# Patient Record
Sex: Male | Born: 1970
Health system: Southern US, Community
[De-identification: ages and names within clinical notes are randomized; demographics above are authoritative.]

## PROBLEM LIST (undated history)

## (undated) DIAGNOSIS — M2550 Pain in unspecified joint: Secondary | ICD-10-CM

## (undated) HISTORY — DX: Pain in unspecified joint: M25.50

## (undated) HISTORY — PX: KNEE ARTHROSCOPY: SUR90

## (undated) HISTORY — PX: SHOULDER ARTHROSCOPY: SHX128

---

## 1999-07-26 ENCOUNTER — Emergency Department (HOSPITAL_COMMUNITY): Admission: EM | Admit: 1999-07-26 | Discharge: 1999-07-26 | Payer: Self-pay | Admitting: Emergency Medicine

## 2000-02-21 ENCOUNTER — Emergency Department (HOSPITAL_COMMUNITY): Admission: EM | Admit: 2000-02-21 | Discharge: 2000-02-21 | Payer: Self-pay | Admitting: Emergency Medicine

## 2000-09-07 ENCOUNTER — Encounter: Payer: Self-pay | Admitting: Emergency Medicine

## 2000-09-07 ENCOUNTER — Emergency Department (HOSPITAL_COMMUNITY): Admission: EM | Admit: 2000-09-07 | Discharge: 2000-09-07 | Payer: Self-pay | Admitting: Emergency Medicine

## 2000-10-02 ENCOUNTER — Emergency Department (HOSPITAL_COMMUNITY): Admission: EM | Admit: 2000-10-02 | Discharge: 2000-10-02 | Payer: Self-pay | Admitting: Emergency Medicine

## 2001-03-05 ENCOUNTER — Emergency Department (HOSPITAL_COMMUNITY): Admission: EM | Admit: 2001-03-05 | Discharge: 2001-03-05 | Payer: Self-pay | Admitting: Emergency Medicine

## 2001-04-21 ENCOUNTER — Encounter: Admission: RE | Admit: 2001-04-21 | Discharge: 2001-07-20 | Payer: Self-pay | Admitting: Occupational Medicine

## 2001-05-03 ENCOUNTER — Emergency Department (HOSPITAL_COMMUNITY): Admission: EM | Admit: 2001-05-03 | Discharge: 2001-05-03 | Payer: Self-pay | Admitting: *Deleted

## 2001-05-28 ENCOUNTER — Emergency Department (HOSPITAL_COMMUNITY): Admission: EM | Admit: 2001-05-28 | Discharge: 2001-05-28 | Payer: Self-pay | Admitting: Emergency Medicine

## 2001-05-28 ENCOUNTER — Encounter: Payer: Self-pay | Admitting: Emergency Medicine

## 2001-05-31 ENCOUNTER — Emergency Department (HOSPITAL_COMMUNITY): Admission: EM | Admit: 2001-05-31 | Discharge: 2001-05-31 | Payer: Self-pay | Admitting: Emergency Medicine

## 2002-11-07 ENCOUNTER — Encounter: Admission: RE | Admit: 2002-11-07 | Discharge: 2002-11-07 | Payer: Self-pay | Admitting: Rheumatology

## 2002-11-07 ENCOUNTER — Encounter: Payer: Self-pay | Admitting: Rheumatology

## 2003-06-17 ENCOUNTER — Emergency Department (HOSPITAL_COMMUNITY): Admission: EM | Admit: 2003-06-17 | Discharge: 2003-06-17 | Payer: Self-pay | Admitting: Emergency Medicine

## 2003-06-26 ENCOUNTER — Emergency Department (HOSPITAL_COMMUNITY): Admission: AD | Admit: 2003-06-26 | Discharge: 2003-06-26 | Payer: Self-pay | Admitting: Family Medicine

## 2004-03-10 ENCOUNTER — Emergency Department (HOSPITAL_COMMUNITY): Admission: EM | Admit: 2004-03-10 | Discharge: 2004-03-10 | Payer: Self-pay | Admitting: Emergency Medicine

## 2005-08-12 ENCOUNTER — Ambulatory Visit: Payer: Self-pay | Admitting: Internal Medicine

## 2005-10-11 ENCOUNTER — Emergency Department (HOSPITAL_COMMUNITY): Admission: EM | Admit: 2005-10-11 | Discharge: 2005-10-11 | Payer: Self-pay | Admitting: Emergency Medicine

## 2005-10-26 ENCOUNTER — Ambulatory Visit: Payer: Self-pay | Admitting: Internal Medicine

## 2006-02-18 ENCOUNTER — Emergency Department (HOSPITAL_COMMUNITY): Admission: EM | Admit: 2006-02-18 | Discharge: 2006-02-18 | Payer: Self-pay | Admitting: Emergency Medicine

## 2006-02-19 ENCOUNTER — Ambulatory Visit: Payer: Self-pay | Admitting: Internal Medicine

## 2007-06-06 ENCOUNTER — Ambulatory Visit: Payer: Self-pay | Admitting: Internal Medicine

## 2007-06-06 LAB — CONVERTED CEMR LAB
ALT: 31 units/L (ref 0–53)
AST: 30 units/L (ref 0–37)
Albumin: 3.8 g/dL (ref 3.5–5.2)
Calcium: 9.4 mg/dL (ref 8.4–10.5)
Eosinophils Absolute: 0.2 10*3/uL (ref 0.0–0.6)
Eosinophils Relative: 2.5 % (ref 0.0–5.0)
GFR calc Af Amer: 99 mL/min
GFR calc non Af Amer: 81 mL/min
HCT: 40.2 % (ref 39.0–52.0)
Hemoglobin: 13.9 g/dL (ref 13.0–17.0)
LDL Cholesterol: 115 mg/dL — ABNORMAL HIGH (ref 0–99)
Lymphocytes Relative: 33.1 % (ref 12.0–46.0)
MCHC: 34.5 g/dL (ref 30.0–36.0)
Monocytes Absolute: 0.7 10*3/uL (ref 0.2–0.7)
Monocytes Relative: 9.3 % (ref 3.0–11.0)
Neutro Abs: 4.4 10*3/uL (ref 1.4–7.7)
Neutrophils Relative %: 55 % (ref 43.0–77.0)
Platelets: 209 10*3/uL (ref 150–400)
Potassium: 4.6 meq/L (ref 3.5–5.1)
RDW: 12.5 % (ref 11.5–14.6)
Sodium: 144 meq/L (ref 135–145)
Triglycerides: 46 mg/dL (ref 0–149)

## 2007-06-14 ENCOUNTER — Ambulatory Visit: Payer: Self-pay | Admitting: Internal Medicine

## 2007-06-14 DIAGNOSIS — A630 Anogenital (venereal) warts: Secondary | ICD-10-CM | POA: Insufficient documentation

## 2007-06-14 DIAGNOSIS — M129 Arthropathy, unspecified: Secondary | ICD-10-CM | POA: Insufficient documentation

## 2007-06-23 ENCOUNTER — Encounter: Payer: Self-pay | Admitting: Internal Medicine

## 2007-07-12 ENCOUNTER — Encounter: Payer: Self-pay | Admitting: Internal Medicine

## 2007-07-14 ENCOUNTER — Encounter: Payer: Self-pay | Admitting: Internal Medicine

## 2007-09-05 ENCOUNTER — Encounter: Payer: Self-pay | Admitting: Internal Medicine

## 2007-09-19 ENCOUNTER — Encounter: Payer: Self-pay | Admitting: Internal Medicine

## 2008-02-21 ENCOUNTER — Ambulatory Visit: Payer: Self-pay | Admitting: Internal Medicine

## 2008-02-21 LAB — CONVERTED CEMR LAB

## 2008-02-22 ENCOUNTER — Telehealth: Payer: Self-pay | Admitting: Internal Medicine

## 2008-10-02 ENCOUNTER — Ambulatory Visit: Payer: Self-pay | Admitting: Internal Medicine

## 2008-10-02 DIAGNOSIS — M549 Dorsalgia, unspecified: Secondary | ICD-10-CM | POA: Insufficient documentation

## 2011-08-06 ENCOUNTER — Encounter (HOSPITAL_COMMUNITY): Payer: Self-pay | Admitting: *Deleted

## 2011-08-06 ENCOUNTER — Emergency Department (HOSPITAL_COMMUNITY)
Admission: EM | Admit: 2011-08-06 | Discharge: 2011-08-06 | Disposition: A | Discharge status: Home / Self Care | Payer: Self-pay | Attending: Emergency Medicine | Admitting: Emergency Medicine

## 2011-08-06 DIAGNOSIS — L989 Disorder of the skin and subcutaneous tissue, unspecified: Secondary | ICD-10-CM | POA: Insufficient documentation

## 2011-08-06 DIAGNOSIS — R109 Unspecified abdominal pain: Secondary | ICD-10-CM | POA: Insufficient documentation

## 2011-08-06 DIAGNOSIS — R110 Nausea: Secondary | ICD-10-CM | POA: Insufficient documentation

## 2011-08-06 DIAGNOSIS — B999 Unspecified infectious disease: Secondary | ICD-10-CM | POA: Insufficient documentation

## 2011-08-06 MED ORDER — CEPHALEXIN 500 MG PO CAPS: 500.0000 mg | ORAL_CAPSULE | Freq: Three times a day (TID) | ORAL | Status: AC

## 2011-08-06 MED ORDER — HYDROCODONE-ACETAMINOPHEN 5-325 MG PO TABS: 1.0000 | ORAL_TABLET | Freq: Four times a day (QID) | ORAL | Status: AC | PRN

## 2011-08-06 NOTE — ED Notes (Signed)
Patient states onset one year ago growth right groin increasing in size and pain overtime. No drainage present. Pain 6-10/10 achy.  Airway intact bilateral equal chest rise and fall.

## 2011-08-06 NOTE — ED Notes (Signed)
Patient discussed complaint with EDP. Patient not in room was given discharge papers. Patient verbalized understanding with EDP.

## 2011-08-06 NOTE — ED Provider Notes (Signed)
History     CSN: [id number]  Arrival date & time 08/06/11  1256   First MD Initiated Contact with Patient 08/06/11 1312      Chief Complaint  Patient presents with  . Abscess    (Consider location/radiation/quality/duration/timing/severity/associated sxs/prior treatment) HPI Comments: Patient reports he has had a growth in his right groin for approximately one year.  States that he has been putting iodine, witch hazel, alcohol, Clorox, and rubber bands on it without improvement.  States that recently it has become much more painful and smells bad, has some kind of discharge coming from it.  Denies fevers.   Patient is a 41 y.o. male presenting with abscess. The history is provided by the patient.  Abscess  Pertinent negatives include no fever and no vomiting.    History reviewed. No pertinent past medical history.  Past Surgical History  Procedure Date  . Knee arthroscopy   . Shoulder arthroscopy     History reviewed. No pertinent family history.  History  Substance Use Topics  . Smoking status: Current Everyday Smoker -- 0.5 packs/day  . Smokeless tobacco: Not on file  . Alcohol Use: Yes     socially      Review of Systems  Constitutional: Negative for fever.  Gastrointestinal: Positive for nausea. Negative for vomiting.  All other systems reviewed and are negative.    Allergies  Review of patient's allergies indicates no known allergies.  Home Medications   Current Outpatient Rx  Name Route Sig Dispense Refill  . HYDROCODONE-ACETAMINOPHEN 5-500 MG PO TABS Oral Take 1 tablet by mouth every 6 (six) hours as needed. For pain      BP 134/74  Pulse 83  Temp(Src) 99 F (37.2 C) (Oral)  Resp 18  Ht 5\' 6"  (1.676 m)  Wt 205 lb (92.987 kg)  BMI 33.09 kg/m2  SpO2 96%  Physical Exam  Nursing note and vitals reviewed. Constitutional: He is oriented to person, place, and time. He appears well-developed and well-nourished.  HENT:  Head: Normocephalic and  atraumatic.  Neck: Neck supple.  Pulmonary/Chest: Effort normal.  Neurological: He is alert and oriented to person, place, and time.  Skin: Lesion noted. No rash noted.          Lesion is not warm.  No edema or erythema.      ED Course  Procedures (including critical care time)  Labs Reviewed - No data to display No results found.  Discussed patient with Dr Patrica Duel who also examined the lesion and agrees with dermatology follow up and no procedure here.     1. Skin lesion   2. Superinfection       MDM  Well-appearing patient with chronic lesion at right groin with appearance of granuloma - patient may have developed a mild superinfection though this lesion is not consistent with an abscess or other acute condition.  Given abnormal appearance, recommended dermatology follow up.  Patient verbalizes understanding and agrees with plan.         Rise Patience, [place] 08/06/11 1549

## 2011-08-06 NOTE — ED Notes (Signed)
Patient states that he had onset of growth x 6 months ago.  Pt states that he cut it off and now it has come back and is 3 times the size that it was.  Pt has been mashing and poking at the area and now has drainage and bleeding with foul smell.
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
## 2011-08-07 NOTE — ED Provider Notes (Signed)
Medical screening examination/treatment/procedure(s) were performed by non-physician practitioner and as supervising physician I was immediately available for consultation/collaboration.   Marrian Bells A. Patrica Duel, MD 08/07/11 1456

## 2011-09-04 ENCOUNTER — Encounter (INDEPENDENT_AMBULATORY_CARE_PROVIDER_SITE_OTHER): Payer: Self-pay | Admitting: General Surgery

## 2011-10-07 ENCOUNTER — Encounter (INDEPENDENT_AMBULATORY_CARE_PROVIDER_SITE_OTHER): Payer: Self-pay | Admitting: Surgery

## 2011-10-07 ENCOUNTER — Ambulatory Visit (INDEPENDENT_AMBULATORY_CARE_PROVIDER_SITE_OTHER): Payer: Self-pay | Admitting: Surgery

## 2011-10-07 ENCOUNTER — Other Ambulatory Visit (INDEPENDENT_AMBULATORY_CARE_PROVIDER_SITE_OTHER): Payer: Self-pay | Admitting: Surgery

## 2011-10-07 VITALS — BP 128/72 | HR 78 | Temp 98.0°F | Resp 18 | Ht 67.0 in | Wt 221.5 lb

## 2011-10-07 DIAGNOSIS — A630 Anogenital (venereal) warts: Secondary | ICD-10-CM

## 2011-10-07 MED ORDER — DOXYCYCLINE HYCLATE 100 MG PO TABS: 100.0000 mg | ORAL_TABLET | Freq: Two times a day (BID) | ORAL | Status: AC

## 2011-10-07 NOTE — Progress Notes (Signed)
Chief Complaint:  Anal warts  History of Present Illness:  Brad Gray is an 41 y.o. male referred by Dr. Janalyn Harder with anal warts.  The patient said that they have recently popped out. He would like to have something done about them. I described exam under anesthesia and excision with a Bovie or obliteration with the CO2 laser. He was to go ahead and schedule this I will go and get this worked out.  Past Medical History  Diagnosis Date  . Joint pain     Past Surgical History  Procedure Date  . Knee arthroscopy   . Shoulder arthroscopy     Current Outpatient Prescriptions  Medication Sig Dispense Refill  . HYDROcodone-acetaminophen (VICODIN) 5-500 MG per tablet Take 1 tablet by mouth every 6 (six) hours as needed. For pain       Review of patient's allergies indicates no known allergies. No family history on file. Social History:   reports that he has been smoking.  He does not have any smokeless tobacco history on file. He reports that he drinks alcohol. He reports that he does not use illicit drugs.   REVIEW OF SYSTEMS - PERTINENT POSITIVES ONLY: negative  Physical Exam:   Blood pressure 128/72, pulse 78, temperature 98 F (36.7 C), temperature source Temporal, resp. rate 18, height 5\' 7"  (1.702 m), weight 221 lb 8 oz (100.472 kg). Body mass index is 34.69 kg/(m^2).  Gen:  WDWN AA man NAD  Neurological: Alert and oriented to person, place, and time. Motor and sensory function is grossly intact  Head: Normocephalic and atraumatic.  Eyes: Conjunctivae are normal. Pupils are equal, round, and reactive to light. No scleral icterus.  Neck: Normal range of motion. Neck supple. No tracheal deviation or thyromegaly present.  Cardiovascular:  SR without murmurs or gallops.  No carotid bruits Respiratory: Effort normal.  No respiratory distress. No chest wall tenderness. Breath sounds normal.  No wheezes, rales or rhonchi.  Abdomen:  nontender GU:  Anal and perianal exam  reveals some flat condyloma acuminata surrounding the anal orifice. He also has a boil on his buttock its more up toward the pilonidal region but I didn't see any evidence for pilonidal. Musculoskeletal: Normal range of motion. Extremities are nontender. No cyanosis, edema or clubbing noted Lymphadenopathy: No cervical, preauricular, postauricular or axillary adenopathy is present Skin: Skin is warm and dry. No rash noted. No diaphoresis. No erythema. No pallor. Pscyh: Normal mood and affect. Behavior is normal. Judgment and thought content normal.   LABORATORY RESULTS: No results found for this or any previous visit (from the past 48 hour(s)).  RADIOLOGY RESULTS: No results found.  Problem List: Patient Active Problem List  Diagnoses  . CONDYLOMA ACUMINATA, PENIS  . ARTHRITIS  . BACK PAIN    Assessment & Plan: Condyloma accuminata of anal canal.   Furuncle of buttock--will treat with 10 days of doxycycline    Matt B. Daphine Deutscher, MD, [facility] [phone] beeper [phone]  10/07/2011 11:19 AM

## 2011-11-19 ENCOUNTER — Encounter (INDEPENDENT_AMBULATORY_CARE_PROVIDER_SITE_OTHER): Payer: Self-pay

## 2012-08-31 ENCOUNTER — Encounter (HOSPITAL_COMMUNITY): Payer: Self-pay | Admitting: *Deleted

## 2012-08-31 ENCOUNTER — Emergency Department (HOSPITAL_COMMUNITY)
Admission: EM | Admit: 2012-08-31 | Discharge: 2012-08-31 | Disposition: A | Discharge status: Home / Self Care | Payer: Self-pay | Attending: Emergency Medicine | Admitting: Emergency Medicine

## 2012-08-31 DIAGNOSIS — T2022XA Burn of second degree of lip(s), initial encounter: Secondary | ICD-10-CM | POA: Insufficient documentation

## 2012-08-31 DIAGNOSIS — T2020XA Burn of second degree of head, face, and neck, unspecified site, initial encounter: Secondary | ICD-10-CM

## 2012-08-31 DIAGNOSIS — F17200 Nicotine dependence, unspecified, uncomplicated: Secondary | ICD-10-CM | POA: Insufficient documentation

## 2012-08-31 DIAGNOSIS — Y929 Unspecified place or not applicable: Secondary | ICD-10-CM | POA: Insufficient documentation

## 2012-08-31 DIAGNOSIS — X12XXXA Contact with other hot fluids, initial encounter: Secondary | ICD-10-CM | POA: Insufficient documentation

## 2012-08-31 DIAGNOSIS — Y9389 Activity, other specified: Secondary | ICD-10-CM | POA: Insufficient documentation

## 2012-08-31 DIAGNOSIS — X131XXA Other contact with steam and other hot vapors, initial encounter: Secondary | ICD-10-CM | POA: Insufficient documentation

## 2012-08-31 DIAGNOSIS — IMO0001 Reserved for inherently not codable concepts without codable children: Secondary | ICD-10-CM | POA: Insufficient documentation

## 2012-08-31 MED ORDER — OXYCODONE-ACETAMINOPHEN 5-325 MG PO TABS: 1.0000 | ORAL_TABLET | ORAL | Status: DC | PRN

## 2012-08-31 MED ORDER — SILVER SULFADIAZINE 1 % EX CREA
TOPICAL_CREAM | Freq: Two times a day (BID) | CUTANEOUS | Status: DC
  Administered 2012-08-31: 14:00:00 via TOPICAL
  Filled 2012-08-31: qty 85

## 2012-08-31 MED ORDER — ACETAMINOPHEN 325 MG PO TABS
650.0000 mg | ORAL_TABLET | Freq: Once | ORAL | Status: AC
  Administered 2012-08-31: 650 mg via ORAL
  Filled 2012-08-31: qty 2
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
## 2012-09-01 NOTE — ED Provider Notes (Signed)
Medical screening examination/treatment/procedure(s) were performed by non-physician practitioner and as supervising physician I was immediately available for consultation/collaboration.   Flint Melter, MD 09/01/12 [phone]

## 2012-12-31 ENCOUNTER — Ambulatory Visit: Payer: Self-pay | Admitting: Emergency Medicine

## 2012-12-31 ENCOUNTER — Ambulatory Visit: Payer: Self-pay

## 2012-12-31 VITALS — BP 144/88 | HR 78 | Temp 98.0°F | Resp 17 | Ht 67.0 in | Wt 220.0 lb

## 2012-12-31 DIAGNOSIS — R059 Cough, unspecified: Secondary | ICD-10-CM

## 2012-12-31 DIAGNOSIS — K6289 Other specified diseases of anus and rectum: Secondary | ICD-10-CM

## 2012-12-31 DIAGNOSIS — R635 Abnormal weight gain: Secondary | ICD-10-CM

## 2012-12-31 DIAGNOSIS — R05 Cough: Secondary | ICD-10-CM

## 2012-12-31 DIAGNOSIS — K629 Disease of anus and rectum, unspecified: Secondary | ICD-10-CM

## 2012-12-31 DIAGNOSIS — A630 Anogenital (venereal) warts: Secondary | ICD-10-CM

## 2012-12-31 DIAGNOSIS — Z202 Contact with and (suspected) exposure to infections with a predominantly sexual mode of transmission: Secondary | ICD-10-CM

## 2012-12-31 DIAGNOSIS — Z2089 Contact with and (suspected) exposure to other communicable diseases: Secondary | ICD-10-CM

## 2012-12-31 LAB — RPR

## 2012-12-31 LAB — POCT URINALYSIS DIPSTICK
Blood, UA: NEGATIVE
Nitrite, UA: NEGATIVE
Protein, UA: NEGATIVE
Urobilinogen, UA: 0.2

## 2012-12-31 LAB — POCT CBC
Granulocyte percent: 62 %G (ref 37–80)
HCT, POC: 36.8 % — AB (ref 43.5–53.7)
Lymph, poc: 2.6 (ref 0.6–3.4)
MCH, POC: 28.7 pg (ref 27–31.2)
MCHC: 31.5 g/dL — AB (ref 31.8–35.4)
MCV: 91 fL (ref 80–97)
MPV: 10.2 fL (ref 0–99.8)
POC LYMPH PERCENT: 32.4 %L (ref 10–50)
Platelet Count, POC: 206 10*3/uL (ref 142–424)
RDW, POC: 13.4 %

## 2012-12-31 LAB — COMPREHENSIVE METABOLIC PANEL
AST: 23 U/L (ref 0–37)
Alkaline Phosphatase: 86 U/L (ref 39–117)
CO2: 27 mEq/L (ref 19–32)
Calcium: 9.9 mg/dL (ref 8.4–10.5)
Creat: 1.12 mg/dL (ref 0.50–1.35)
Glucose, Bld: 87 mg/dL (ref 70–99)
Total Bilirubin: 0.5 mg/dL (ref 0.3–1.2)

## 2012-12-31 LAB — RHEUMATOID FACTOR: Rhuematoid fact SerPl-aCnc: <10 IU/mL (ref <=14)

## 2012-12-31 LAB — POCT SEDIMENTATION RATE: POCT SED RATE: 2 mm/hr (ref 0–22)

## 2012-12-31 LAB — HIV ANTIBODY (ROUTINE TESTING W REFLEX): HIV: NONREACTIVE

## 2012-12-31 LAB — GLUCOSE, POCT (MANUAL RESULT ENTRY): POC Glucose: 76 mg/dl (ref 70–99)

## 2012-12-31 MED ORDER — PODOFILOX 0.5 % EX GEL: Freq: Two times a day (BID) | CUTANEOUS | Status: DC

## 2012-12-31 NOTE — Progress Notes (Deleted)
Subjective:    Patient ID: Brad Gray, male    DOB: [birth year], 42 y.o.   MRN: [id number]  Exposure to STD  Back Pain  Shoulder Pain      Review of Systems  Musculoskeletal: Positive for back pain.       Objective:   Physical Exam        Assessment & Plan:     Patient ID: Brad Gray MRN: [id number], DOB: 07-May-1971 42 y.o. Date of Encounter: 12/31/2012, 2:35 PM  Primary Physician: No PCP Per Patient  Chief Complaint: Physical (CPE)  HPI: 42 y.o. y/o male with history noted below here for CPE.  Doing well. No issues/complaints.  Review of Systems:*** Consitutional: No fever, chills, night sweats, lymphadenopathy, or weight changes. Patient complains of: FATIGUE, UNINTENTIONAL WEIGHT GAIN Eyes: No visual changes, eye redness, or discharge. Patient complains of VISUAL DISTURBANCE ENT/Mouth: Ears: No otalgia, tinnitus, hearing loss, discharge. Nose: No congestion, rhinorrhea, sinus pain, or epistaxis. Throat: No sore throat, post nasal drip, or teeth pain. Cardiovascular: No CP, palpitations, diaphoresis, DOE, edema, orthopnea, PND. Respiratory: No cough, hemoptysis, SOB, or wheezing. Patient complains of: SHORTNESS OF BREATH Gastrointestinal: No anorexia, dysphagia, reflux, pain, nausea, vomiting, hematemesis, diarrhea, constipation, BRBPR, or melena. Genitourinary: No dysuria, frequency, urgency, hematuria, incontinence, nocturia, decreased urinary stream, discharge, impotence, or testicular pain/masses. Patient complains of: a GENITAL SORE Musculoskeletal: No stiffness, joint swelling, or weakness. Patient complains of:  JOINT PAIN, BACK PAIN, MUSCLE PAIN, A PROBLEM WITH WALKING Skin: RASH, no erythema, lesion changes, pain, warmth, jaundice, or pruritis. Neurological: HEADACHE, no dizziness, syncope, seizures, tremors, memory loss, coordination problems, or paresthesias. Psychological: No anxiety, depression, hallucinations, SI/HI. Endocrine: No fatigue,  polydipsia, polyphagia, polyuria, or known diabetes. All other systems were reviewed and are otherwise negative.  Past Medical History  Diagnosis Date  . Joint pain      Past Surgical History  Procedure Laterality Date  . Knee arthroscopy    . Shoulder arthroscopy      Home Meds:  Prior to Admission medications   Medication Sig Start Date End Date Taking? Authorizing Provider  ibuprofen (ADVIL,MOTRIN) 800 MG tablet Take 800 mg by mouth every 8 (eight) hours as needed for pain.    Historical Provider, MD  Neomycin-Polymyxin (ANTIBIOTIC EX) Apply 1 application topically daily as needed (Using for swelling and scarring to left eye.).    Historical Provider, MD  oxyCODONE-acetaminophen (PERCOCET) 5-325 MG per tablet Take 1 tablet by mouth every 4 (four) hours as needed for pain. 08/31/12   Glade Nurse, PA-C  [facility] HAZEL EX Apply 1 application topically daily as needed (Swelling, and scarring.).    Historical Provider, MD    Allergies: No Known Allergies  History   Social History  . Marital Status: Single    Spouse Name: N/A    Number of Children: N/A  . Years of Education: N/A   Occupational History  . Not on file.   Social History Main Topics  . Smoking status: Current Every Day Smoker -- 0.50 packs/day  . Smokeless tobacco: Not on file  . Alcohol Use: Yes     Comment: socially  . Drug Use: No  . Sexually Active: Yes    Birth Control/ Protection: None   Other Topics Concern  . Not on file   Social History Narrative  . No narrative on file    History reviewed. No pertinent family history.  Physical Exam:*** Blood pressure 144/88, pulse 78, temperature 98 F (36.7 C), temperature source  Oral, resp. rate 17, height 5\' 7"  (1.702 m), weight 220 lb (99.791 kg), SpO2 100.00%.  General: Well developed, well nourished, in no acute distress. HEENT: Normocephalic, atraumatic. Conjunctiva pink, sclera non-icteric. Pupils 2 mm constricting to 1 mm, round, regular, and  equally reactive to light and accomodation. EOMI. Internal auditory canal clear. TMs with good cone of light and without pathology. Nasal mucosa pink. Nares are without discharge. No sinus tenderness. Oral mucosa pink. Dentition***. Pharynx without exudate.   Neck: Supple. Trachea midline. No thyromegaly. Full ROM. No lymphadenopathy. Lungs: Clear to auscultation bilaterally without wheezes, rales, or rhonchi. Breathing is of normal effort and unlabored. Cardiovascular: RRR with S1 S2. No murmurs, rubs, or gallops appreciated. Distal pulses 2+ symmetrically. No carotid or abdominal bruits Abdomen: Soft, non-tender, non-distended with normoactive bowel sounds. No hepatosplenomegaly or masses. No rebound/guarding. No CVA tenderness. Without hernias.  Rectal: No external hemorrhoids or fissures. Rectal vault without masses.***  Genitourinary: *** circumcised male. No penile lesions. Testes descended bilaterally, and smooth without tenderness or masses.  Musculoskeletal: Full range of motion and 5/5 strength throughout. Without swelling, atrophy, tenderness, crepitus, or warmth. Extremities without clubbing, cyanosis, or edema. Calves supple. Skin: Warm and moist without erythema, ecchymosis, wounds, or rash. Neuro: A+Ox3. CN II-XII grossly intact. Moves all extremities spontaneously. Full sensation throughout. Normal gait. DTR 2+ throughout upper and lower extremities. Finger to nose intact. Psych:  Responds to questions appropriately with a normal affect.   Studies: CBC, CMET, Lipid, PSA, TSH, Vitamin D all pending. Patient is *** UA: ***  Assessment/Plan:  42 y.o. y/o *** male here for CPE*** -

## 2012-12-31 NOTE — Progress Notes (Signed)
Subjective:    Patient ID: Brad Gray, male    DOB: May 17, 1971, 42 y.o.   MRN: [id number]  Exposure to STD  Back Pain  Shoulder Pain      Review of Systems  Musculoskeletal: Positive for back pain.       Objective:   Physical Exam        Assessment & Plan:     Patient ID: Brad Gray MRN: [id number], DOB: 1971-02-21 42 y.o. Date of Encounter: 12/31/2012, 3:05 PM  Primary Physician: No PCP Per Patient  Chief Complaint: Physical (CPE)  HPI: 42 y.o. y/o male with history noted below here for CPE.  Doing well. No issues/complaints.  Review of Systems:  Consitutional: No fever, chills, night sweats, lymphadenopathy, or weight changes. Patient complains of: FATIGUE, UNINTENTIONAL WEIGHT GAIN Eyes: No visual changes, eye redness, or discharge. Patient complains of VISUAL DISTURBANCE ENT/Mouth: Ears: No otalgia, tinnitus, hearing loss, discharge. Nose: No congestion, rhinorrhea, sinus pain, or epistaxis. Throat: No sore throat, post nasal drip, or teeth pain. Cardiovascular: No CP, palpitations, diaphoresis, DOE, edema, orthopnea, PND. Respiratory: No cough, hemoptysis, SOB, or wheezing. Patient complains of: SHORTNESS OF BREATH Gastrointestinal: No anorexia, dysphagia, reflux, pain, nausea, vomiting, hematemesis, diarrhea, constipation, BRBPR, or melena. Genitourinary: No dysuria, frequency, urgency, hematuria, incontinence, nocturia, decreased urinary stream, discharge, impotence, or testicular pain/masses. Patient complains of: a GENITAL SORE Musculoskeletal: No stiffness, joint swelling, or weakness. Patient complains of:  JOINT PAIN, BACK PAIN, MUSCLE PAIN, A PROBLEM WITH WALKING Skin: RASH, no erythema, lesion changes, pain, warmth, jaundice, or pruritis. Neurological: HEADACHE, no dizziness, syncope, seizures, tremors, memory loss, coordination problems, or paresthesias. Psychological: No anxiety, depression, hallucinations, SI/HI. Endocrine: No fatigue,  polydipsia, polyphagia, polyuria, or known diabetes. All other systems were reviewed and are otherwise negative.  Past Medical History  Diagnosis Date  . Joint pain      Past Surgical History  Procedure Laterality Date  . Knee arthroscopy    . Shoulder arthroscopy      Home Meds:  Prior to Admission medications   Medication Sig Start Date End Date Taking? Authorizing Provider  ibuprofen (ADVIL,MOTRIN) 800 MG tablet Take 800 mg by mouth every 8 (eight) hours as needed for pain.    Historical Provider, MD  Neomycin-Polymyxin (ANTIBIOTIC EX) Apply 1 application topically daily as needed (Using for swelling and scarring to left eye.).    Historical Provider, MD  oxyCODONE-acetaminophen (PERCOCET) 5-325 MG per tablet Take 1 tablet by mouth every 4 (four) hours as needed for pain. 08/31/12   Glade Nurse, PA-C  [facility] HAZEL EX Apply 1 application topically daily as needed (Swelling, and scarring.).    Historical Provider, MD    Allergies: No Known Allergies  History   Social History  . Marital Status: Single    Spouse Name: N/A    Number of Children: N/A  . Years of Education: N/A   Occupational History  . Not on file.   Social History Main Topics  . Smoking status: Current Every Day Smoker -- 0.50 packs/day  . Smokeless tobacco: Not on file  . Alcohol Use: Yes     Comment: socially  . Drug Use: No  . Sexually Active: Yes    Birth Control/ Protection: None   Other Topics Concern  . Not on file   Social History Narrative  . No narrative on file    History reviewed. No pertinent family history.  Physical Exam:  Blood pressure 144/88, pulse 78, temperature 98 F (36.7 C),  temperature source Oral, resp. rate 17, height 5\' 7"  (1.702 m), weight 220 lb (99.791 kg), SpO2 100.00%.  General: Well developed, well nourished, in no acute distress. HEENT: Normocephalic, atraumatic. Conjunctiva pink, sclera non-icteric. Pupils 2 mm constricting to 1 mm, round, regular, and equally  reactive to light and accomodation. EOMI. Internal auditory canal clear. TMs with good cone of light and without pathology. Nasal mucosa pink. Nares are without discharge. No sinus tenderness. Oral mucosa pink. Dentition . Pharynx without exudate.   Neck: Supple. Trachea midline. No thyromegaly. Full ROM. No lymphadenopathy. Lungs: Clear to auscultation bilaterally without wheezes, rales, or rhonchi. Breathing is of normal effort and unlabored. Cardiovascular: RRR with S1 S2. No murmurs, rubs, or gallops appreciated. Distal pulses 2+ symmetrically. No carotid or abdominal bruits Abdomen: Soft, non-tender, non-distended with normoactive bowel sounds. No hepatosplenomegaly or masses. No rebound/guarding. No CVA tenderness. Without hernias.  Rectal prostate feels normal there are multiple perianal warts. Genitourinary:   circumcised male. He has multiple condyloma on the left side of his penis. . Testes descended bilaterally, and smooth without tenderness or masses.  Musculoskeletal: Full range of motion and 5/5 strength throughout. Without swelling, atrophy, tenderness, crepitus, or warmth. Extremities without clubbing, cyanosis, or edema. Calves supple. He has significant ulnar deviation of both wrists. Skin: Warm and moist without erythema, ecchymosis, wounds, or rash. Neuro: A+Ox3. CN II-XII grossly intact. Moves all extremities spontaneously. Full sensation throughout. Normal gait. DTR 2+ throughout upper and lower extremities. Finger to nose intact. Psych:  Responds to questions appropriately with a normal affect.  [facility] reading (PRIMARY) by  Dr. Cleta Alberts no acute disease Results for orders placed in visit on 12/31/12  POCT URINALYSIS DIPSTICK      Result Value Range   Color, UA YELLOW     Clarity, UA CLEAR     Glucose, UA NEG     Bilirubin, UA NEG     Ketones, UA NEG     Spec Grav, UA 1.025     Blood, UA NEG     pH, UA 6.0     Protein, UA NEG     Urobilinogen, UA 0.2     Nitrite, UA NEG       Leukocytes, UA Negative    POCT CBC      Result Value Range   WBC 8.0  4.6 - 10.2 K/uL   Lymph, poc 2.6  0.6 - 3.4   POC LYMPH PERCENT 32.4  10 - 50 %L   MID (cbc) 0.4  0 - 0.9   POC MID % 5.6  0 - 12 %M   POC Granulocyte 5.0  2 - 6.9   Granulocyte percent 62.0  37 - 80 %G   RBC 4.04 (*) 4.69 - 6.13 M/uL   Hemoglobin 11.6 (*) 14.1 - 18.1 g/dL   HCT, POC 40.1 (*) 02.7 - 53.7 %   MCV 91.0  80 - 97 fL   MCH, POC 28.7  27 - 31.2 pg   MCHC 31.5 (*) 31.8 - 35.4 g/dL   RDW, POC 25.3     Platelet Count, POC 206  142 - 424 K/uL   MPV 10.2  0 - 99.8 fL  GLUCOSE, POCT (MANUAL RESULT ENTRY)      Result Value Range   POC Glucose 76  70 - 99 mg/dl   hemosure  negative  Studies:       Assessment/Plan:  42 y.o. labs were done. He was given a prescription for Condylox for his  venereal warts. Await further treatment depending on what shows up on his blood work.   -

## 2012-12-31 NOTE — Patient Instructions (Signed)
Genital Warts Genital warts are a sexually transmitted infection. They may appear as small bumps on the tissues of the genital area. CAUSES  Genital warts are caused by a virus called human papillomavirus (HPV). HPV is the most common sexually transmitted disease (STD) and infection of the sex organs. This infection is spread by having unprotected sex with an infected person. It can be spread by vaginal, anal, and oral sex. Many people do not know they are infected. They may be infected for years without problems. However, even if they do not have problems, they can unknowingly pass the infection to their sexual partners. SYMPTOMS   Itching and irritation in the genital area.  Warts that bleed.  Painful sexual intercourse. DIAGNOSIS  Warts are usually recognized with the naked eye on the vagina, vulva, perineum, anus, and rectum. Certain tests can also diagnose genital warts, such as:  A Pap test.  A tissue sample (biopsy) exam.  Colposcopy. A magnifying tool is used to examine the vagina and cervix. The HPV cells will change color when certain solutions are used. TREATMENT  Warts can be removed by:  Applying certain chemicals, such as cantharidin or podophyllin.  Liquid nitrogen freezing (cryotherapy).  Immunotherapy with candida or trichophyton injections.  Laser treatment.  Burning with an electrified probe (electrocautery).  Interferon injections.  Surgery. PREVENTION  HPV vaccination can help prevent HPV infections that cause genital warts and that cause cancer of the cervix. It is recommended that the vaccination be given to people between the ages 9 to 26 years old. The vaccine might not work as well or might not work at all if you already have HPV. It should not be given to pregnant women. HOME CARE INSTRUCTIONS   It is important to follow your caregiver's instructions. The warts will not go away without treatment. Repeat treatments are often needed to get rid of warts.  Even after it appears that the warts are gone, the normal tissue underneath often remains infected.  Do not try to treat genital warts with medicine used to treat hand warts. This type of medicine is strong and can burn the skin in the genital area, causing more damage.  Tell your past and current sexual partner(s) that you have genital warts. They may be infected also and need treatment.  Avoid sexual contact while being treated.  Do not touch or scratch the warts. The infection may spread to other parts of your body.  Women with genital warts should have a cervical cancer check (Pap test) at least once a year. This type of cancer is slow-growing and can be cured if found early. Chances of developing cervical cancer are increased with HPV.  Inform your obstetrician about your warts in the event of pregnancy. This virus can be passed to the baby's respiratory tract. Discuss this with your caregiver.  Use a condom during sexual intercourse. Following treatment, the use of condoms will help prevent reinfection.  Ask your caregiver about using over-the-counter anti-itch creams. SEEK MEDICAL CARE IF:   Your treated skin becomes red, swollen, or painful.  You have a fever.  You feel generally ill.  You feel little lumps in and around your genital area.  You are bleeding or have painful sexual intercourse. MAKE SURE YOU:   Understand these instructions.  Will watch your condition.  Will get help right away if you are not doing well or get worse. Document Released: 06/19/2000 Document Revised: 09/14/2011 Document Reviewed: 12/29/2010 [employer] Patient Information 2014 [employer].  

## 2013-01-01 LAB — GC/CHLAMYDIA PROBE AMP
CT Probe RNA: NEGATIVE
GC Probe RNA: NEGATIVE

## 2013-01-01 LAB — SICKLE CELL SCREEN: Sickle Cell Screen: NEGATIVE

## 2013-01-23 ENCOUNTER — Ambulatory Visit: Payer: Self-pay | Admitting: Emergency Medicine

## 2013-01-23 VITALS — BP 142/91 | HR 78 | Temp 97.9°F | Resp 18 | Wt 222.0 lb

## 2013-01-23 DIAGNOSIS — L989 Disorder of the skin and subcutaneous tissue, unspecified: Secondary | ICD-10-CM

## 2013-01-23 DIAGNOSIS — R238 Other skin changes: Secondary | ICD-10-CM

## 2013-01-23 DIAGNOSIS — A630 Anogenital (venereal) warts: Secondary | ICD-10-CM

## 2013-01-23 MED ORDER — MUPIROCIN 2 % EX OINT: TOPICAL_OINTMENT | Freq: Three times a day (TID) | CUTANEOUS | Status: DC

## 2013-01-23 NOTE — Progress Notes (Signed)
  Subjective:    Patient ID: Brad Gray, male    DOB: 1970-10-28, 42 y.o.   MRN: [id number]  HPI patient here to recheck on his condylomata. He has been using Condylox gel to he enters today because the shaft of his penis on the left has become very raw and irritated.   Review of Systems     Objective:   Physical Exam on the left there is loss of skin in an area approximately 2 x 2 centimeters on the penile shaft. The testicle is normal.        Assessment & Plan:  Patient here with skin irritation secondary to application of Condylox. I instructed him about using Vaseline and a thin piece of gauze to protect the skin. He is to take a holiday from using the medication at the present time. Will use Bactroban ointment to apply to his penis.

## 2014-08-06 ENCOUNTER — Other Ambulatory Visit: Payer: Self-pay | Admitting: Rheumatology

## 2014-08-06 ENCOUNTER — Ambulatory Visit
Admission: RE | Admit: 2014-08-06 | Discharge: 2014-08-06 | Disposition: A | Discharge status: Home / Self Care | Payer: 59 | Source: Ambulatory Visit | Attending: Rheumatology | Admitting: Rheumatology

## 2014-08-06 DIAGNOSIS — M546 Pain in thoracic spine: Secondary | ICD-10-CM

## 2014-08-06 DIAGNOSIS — M545 Low back pain: Secondary | ICD-10-CM

## 2015-03-09 ENCOUNTER — Encounter (HOSPITAL_COMMUNITY): Payer: Self-pay | Admitting: *Deleted

## 2015-03-09 ENCOUNTER — Emergency Department (HOSPITAL_COMMUNITY)
Admission: EM | Admit: 2015-03-09 | Discharge: 2015-03-09 | Disposition: A | Discharge status: Home / Self Care | Payer: BLUE CROSS/BLUE SHIELD | Attending: Emergency Medicine | Admitting: Emergency Medicine

## 2015-03-09 DIAGNOSIS — Z79899 Other long term (current) drug therapy: Secondary | ICD-10-CM | POA: Diagnosis not present

## 2015-03-09 DIAGNOSIS — Z720 Tobacco use: Secondary | ICD-10-CM | POA: Diagnosis not present

## 2015-03-09 DIAGNOSIS — K0889 Other specified disorders of teeth and supporting structures: Secondary | ICD-10-CM

## 2015-03-09 DIAGNOSIS — K088 Other specified disorders of teeth and supporting structures: Secondary | ICD-10-CM | POA: Diagnosis present

## 2015-03-09 MED ORDER — MORPHINE SULFATE (PF) 4 MG/ML IV SOLN
6.0000 mg | Freq: Once | INTRAVENOUS | Status: AC
  Administered 2015-03-09: 6 mg via INTRAMUSCULAR
  Filled 2015-03-09: qty 2

## 2015-03-09 MED ORDER — OXYCODONE-ACETAMINOPHEN 5-325 MG PO TABS: 2.0000 | ORAL_TABLET | ORAL | Status: DC | PRN

## 2015-03-09 MED ORDER — ONDANSETRON 4 MG PO TBDP
4.0000 mg | ORAL_TABLET | Freq: Once | ORAL | Status: AC
  Administered 2015-03-09: 4 mg via ORAL
  Filled 2015-03-09: qty 1

## 2015-03-09 NOTE — Discharge Instructions (Signed)
Return to the ED with any concerns including fever/chills, difficulty swallowing or breathing, vomiting and not able to keep down antibiotics, decreased level of alertness/lethargy, or any other alarming symptoms  You should stop taking the hydrocodone and take the oxycodone as prescribed to see if this helps your pain more

## 2015-03-09 NOTE — ED Provider Notes (Signed)
CSN: [id number]     Arrival date & time 03/09/15  0120 History  This chart was scribed for Jerelyn Scott, MD by Freida Busman, ED Scribe. This patient was seen in room A11C/A11C and the patient's care was started 1:58 AM.  Chief Complaint  Patient presents with  . Dental Problem   The history is provided by the patient. No language interpreter was used.    HPI Comments:  Brad Gray is a 44 y.o. male who presents to the Emergency Department complaining of constant right sided facial pain and right sided HA for a few days. Pt states he had a right sided tooth extracted on 8/29 and then a root canal on 9/1. He notes he has had the pain since the initial extraction. He has been taking the prescribed antibiotic, hydrocodone and ibuprofen without relief. He tried to follow up with his dentist but they were closed. Pt denies fever. Pain radiates to right scalp and right jaw.  No difficulty breathing or swallowing.  There are no other associated systemic symptoms, there are no other alleviating or modifying factors.   Past Medical History  Diagnosis Date  . Joint pain    Past Surgical History  Procedure Laterality Date  . Knee arthroscopy    . Shoulder arthroscopy     No family history on file. Social History  Substance Use Topics  . Smoking status: Current Every Day Smoker -- 0.50 packs/day  . Smokeless tobacco: None  . Alcohol Use: Yes     Comment: socially    Review of Systems  Constitutional: Negative for fever.  HENT: Positive for dental problem.        + Right sided Facial Pain   Neurological: Positive for headaches.  All other systems reviewed and are negative.   Allergies  Review of patient's allergies indicates no known allergies.  Home Medications   Prior to Admission medications   Medication Sig Start Date End Date Taking? Authorizing Provider  mupirocin ointment (BACTROBAN) 2 % Apply topically 3 (three) times daily. 01/23/13   Collene Gobble, MD  Neomycin-Polymyxin  (ANTIBIOTIC EX) Apply 1 application topically daily as needed (Using for swelling and scarring to left eye.).    Historical Provider, MD  oxyCODONE-acetaminophen (PERCOCET/ROXICET) 5-325 MG per tablet Take 2 tablets by mouth every 4 (four) hours as needed for severe pain. 03/09/15   Jerelyn Scott, MD  podofilox (CONDYLOX) 0.5 % gel Apply topically 2 (two) times daily. Apply topically twice a day for 3 days and then do not apply for 4 days and then reapply for 3 days off 4 days until lesions have resolved. You can complete up to 3 cycles 12/31/12   Collene Gobble, MD  [facility] HAZEL EX Apply 1 application topically daily as needed (Swelling, and scarring.).    Historical Provider, MD   BP 130/72 mmHg  Pulse 60  Temp(Src) 98.4 F (36.9 C) (Oral)  Resp 16  Wt 202 lb 6.4 oz (91.808 kg)  SpO2 96%  Vitals reviewed Physical Exam  Physical Examination: General appearance - alert, well appearing, and in no distress Mental status - alert, oriented to person, place, and time Eyes -no conjunctival injection, no scleral icterus Mouth - mucous membranes moist, pharynx normal without lesions, no gingival abscess, area of tooth removal present on right lower gum, no prurulence of signfiicnat bleeding or erythema Neck - supple, no significant adenopathy Chest - clear to auscultation, no wheezes, rales or rhonchi, symmetric air entry Heart - normal rate,  regular rhythm, normal S1, S2, no murmurs, rubs, clicks or gallops Neurological - alert, oriented, normal speech Extremities - peripheral pulses normal, no pedal edema, no clubbing or cyanosis Skin - normal coloration and turgor, no rashes  ED Course  Procedures   DIAGNOSTIC STUDIES:  Oxygen Saturation is 96% on RA, normal by my interpretation.    COORDINATION OF CARE:  2:03 AM Discussed treatment plan with pt at bedside and pt agreed to plan.  Labs Review Labs Reviewed - No data to display  Imaging Review No results found. I have personally reviewed  and evaluated these images and lab results as part of my medical decision-making.   EKG Interpretation None      MDM   Final diagnoses:  Pain, dental    Pt presenting with c/o dental pain, he recently had tooth extraction and root canal and has continued having pain.  No fever, no significant oral swelling or difficulty breathing or swallowing, no sign of abscess.  Pt on clindamycin, ibuprofen, hydrocodone.  Given rx for percocet instead of hydrocodone, advised close f/u with dentist.  Discharged with strict return precautions.  Pt agreeable with plan.  I personally performed the services described in this documentation, which was scribed in my presence. The recorded information has been reviewed and is accurate.    Jerelyn Scott, MD 03/13/15 [phone]

## 2015-03-09 NOTE — ED Notes (Signed)
Pt left with all belongings and ambulated out of treatment area.  

## 2015-03-09 NOTE — ED Notes (Signed)
The pt is c/o pain in his mouth and face  He had a tooth  Extracted on Monday anda root canal on Wednesday.  Pain since then

## 2015-06-05 ENCOUNTER — Encounter: Payer: Self-pay | Admitting: Gastroenterology

## 2015-06-17 ENCOUNTER — Telehealth: Payer: Self-pay | Admitting: Gastroenterology

## 2015-06-17 ENCOUNTER — Ambulatory Visit: Payer: BLUE CROSS/BLUE SHIELD | Admitting: Gastroenterology

## 2015-06-17 NOTE — Telephone Encounter (Signed)
Please advise Sir? Thank you. 

## 2015-06-18 NOTE — Telephone Encounter (Signed)
No charge this time. 

## 2017-05-26 ENCOUNTER — Encounter (HOSPITAL_COMMUNITY): Payer: Self-pay | Admitting: Emergency Medicine

## 2017-05-26 ENCOUNTER — Emergency Department (HOSPITAL_COMMUNITY): Payer: Self-pay

## 2017-05-26 ENCOUNTER — Emergency Department (HOSPITAL_COMMUNITY)
Admission: EM | Admit: 2017-05-26 | Discharge: 2017-05-26 | Disposition: A | Discharge status: Home / Self Care | Payer: Self-pay | Attending: Emergency Medicine | Admitting: Emergency Medicine

## 2017-05-26 DIAGNOSIS — G8929 Other chronic pain: Secondary | ICD-10-CM | POA: Insufficient documentation

## 2017-05-26 DIAGNOSIS — D7389 Other diseases of spleen: Secondary | ICD-10-CM

## 2017-05-26 DIAGNOSIS — K529 Noninfective gastroenteritis and colitis, unspecified: Secondary | ICD-10-CM | POA: Insufficient documentation

## 2017-05-26 DIAGNOSIS — R109 Unspecified abdominal pain: Secondary | ICD-10-CM

## 2017-05-26 DIAGNOSIS — D739 Disease of spleen, unspecified: Secondary | ICD-10-CM | POA: Insufficient documentation

## 2017-05-26 DIAGNOSIS — A630 Anogenital (venereal) warts: Secondary | ICD-10-CM | POA: Insufficient documentation

## 2017-05-26 DIAGNOSIS — M549 Dorsalgia, unspecified: Secondary | ICD-10-CM | POA: Insufficient documentation

## 2017-05-26 LAB — CBC WITH DIFFERENTIAL/PLATELET
BASOS ABS: 0 10*3/uL (ref 0.0–0.1)
BASOS PCT: 0 %
EOS ABS: 0.1 10*3/uL (ref 0.0–0.7)
Eosinophils Relative: 1 %
HCT: 43.3 % (ref 39.0–52.0)
HEMOGLOBIN: 14.3 g/dL (ref 13.0–17.0)
Lymphocytes Relative: 36 %
Lymphs Abs: 4 10*3/uL (ref 0.7–4.0)
MCH: 29.6 pg (ref 26.0–34.0)
MCHC: 33 g/dL (ref 30.0–36.0)
MCV: 89.6 fL (ref 78.0–100.0)
MONOS PCT: 5 %
Monocytes Absolute: 0.5 10*3/uL (ref 0.1–1.0)
NEUTROS ABS: 6.5 10*3/uL (ref 1.7–7.7)
NEUTROS PCT: 58 %
Platelets: 253 10*3/uL (ref 150–400)
RBC: 4.83 MIL/uL (ref 4.22–5.81)
RDW: 14.4 % (ref 11.5–15.5)
WBC: 11.1 10*3/uL — ABNORMAL HIGH (ref 4.0–10.5)

## 2017-05-26 LAB — COMPREHENSIVE METABOLIC PANEL
ALK PHOS: 91 U/L (ref 38–126)
ALT: 45 U/L (ref 17–63)
ANION GAP: 8 (ref 5–15)
AST: 29 U/L (ref 15–41)
Albumin: 4.4 g/dL (ref 3.5–5.0)
BILIRUBIN TOTAL: 0.5 mg/dL (ref 0.3–1.2)
BUN: 12 mg/dL (ref 6–20)
CALCIUM: 9.4 mg/dL (ref 8.9–10.3)
CO2: 24 mmol/L (ref 22–32)
CREATININE: 1.11 mg/dL (ref 0.61–1.24)
Chloride: 106 mmol/L (ref 101–111)
Glucose, Bld: 96 mg/dL (ref 65–99)
Potassium: 3.6 mmol/L (ref 3.5–5.1)
SODIUM: 138 mmol/L (ref 135–145)
TOTAL PROTEIN: 7.5 g/dL (ref 6.5–8.1)

## 2017-05-26 LAB — URINALYSIS, ROUTINE W REFLEX MICROSCOPIC
BILIRUBIN URINE: NEGATIVE
Glucose, UA: NEGATIVE mg/dL
Hgb urine dipstick: NEGATIVE
KETONES UR: NEGATIVE mg/dL
LEUKOCYTES UA: NEGATIVE
NITRITE: NEGATIVE
PH: 5 (ref 5.0–8.0)
PROTEIN: NEGATIVE mg/dL
Specific Gravity, Urine: 1.021 (ref 1.005–1.030)

## 2017-05-26 LAB — SEDIMENTATION RATE: SED RATE: 7 mm/h (ref 0–16)

## 2017-05-26 LAB — LIPASE, BLOOD: Lipase: 38 U/L (ref 11–51)

## 2017-05-26 MED ORDER — CELECOXIB 200 MG PO CAPS: 200.0000 mg | ORAL_CAPSULE | Freq: Two times a day (BID) | ORAL | 0 refills | Status: DC

## 2017-05-26 MED ORDER — PODOFILOX 0.5 % EX GEL
Freq: Two times a day (BID) | CUTANEOUS | 0 refills | Status: DC
Start: 2017-05-26 — End: 2017-05-26

## 2017-05-26 MED ORDER — PODOFILOX 0.5 % EX SOLN: Freq: Two times a day (BID) | CUTANEOUS | 0 refills | Status: DC

## 2017-05-26 MED ORDER — KETOROLAC TROMETHAMINE 30 MG/ML IJ SOLN
30.0000 mg | Freq: Once | INTRAMUSCULAR | Status: AC
  Administered 2017-05-26: 30 mg via INTRAVENOUS
  Filled 2017-05-26: qty 1

## 2017-05-26 MED ORDER — IOPAMIDOL (ISOVUE-300) INJECTION 61%
100.0000 mL | Freq: Once | INTRAVENOUS | Status: AC | PRN
  Administered 2017-05-26: 100 mL via INTRAVENOUS

## 2017-05-26 NOTE — Discharge Instructions (Signed)
Take loperamide (Imodium AD) as needed for diarrhea.  Take acetaminophen as needed for additional pain relief.  Take omeprazole (Prilosec OTC) once a day.  You nay need to be evaluated by a gastroenterologist (stomach specialist) and a rheumatologist (arthritis specialist).  Your CT scan showed something on your spleen. It is probably a hemangioma (collectin of blood vessels), but radiologist recommends

## 2017-05-26 NOTE — ED Triage Notes (Signed)
Patient complains of abdominal pain x 1 year. Patient states chronic bowel problems. States chronic diarrhea and fatigue. States nausea no vomiting.

## 2017-05-26 NOTE — ED Provider Notes (Signed)
[id number] [facility] EMERGENCY DEPARTMENT Provider Note   CSN: [id number] Arrival date & time: 05/26/17  1617     History   Chief Complaint Chief Complaint  Patient presents with  . Abdominal Pain    HPI Brad Gray is a 46 y.o. male.  The history is provided by the patient.  He has multiple complaints.  He has been having pain throughout his back over the last 6 months.  Pain is getting worse.  It is from the shoulder blade down to the pelvic area and radiates to both sides.  There is occasional radiation to the hips.  Pain is worse with any movement.  He had a job which involves a lot of sitting which made it worse.  Now his job involves a lot of standing which is making it worse.  He had been taking ibuprofen which gave slight relief, but caused stomach upset.  His mother gave him something with acetaminophen in it which does give slight relief.  He does relate that he has pain in multiple joints.  He has been tested for rheumatoid arthritis in the past, but tests have been negative.  A second complaint is generalized GI distress.  He states that he has episodes of epigastric pain and some belches where he can taste stomach acid.  He has frequent loose bowel movements-usually having to-3 bowel movements a day.  There is no blood or mucus in the stool.  He has some vague, generalized abdominal pain which is constant.  GI symptoms have been present for about a year.  He expresses concerns that his problems may be related to his internal organs.  In the past, regular testing did not show any problems, but CT scan did give a diagnosis.  He also wishes to be tested for sexually transmitted infections.  He has history of HPV with genital warts on the penis and perirectal area.  He denies fever or chills.  He denies nausea or vomiting.  He denies weight loss.  He does smoke 1 pack of cigarettes a day.  Past Medical History:  Diagnosis Date  . Joint pain     Patient Active Problem List   Diagnosis  Date Noted  . Condyloma acuminata-perirectal 10/07/2011  . BACK PAIN 10/02/2008  . CONDYLOMA ACUMINATA, PENIS 06/14/2007  . ARTHRITIS 06/14/2007    Past Surgical History:  Procedure Laterality Date  . KNEE ARTHROSCOPY    . SHOULDER ARTHROSCOPY         Home Medications    Prior to Admission medications   Medication Sig Start Date End Date Taking? Authorizing Provider  mupirocin ointment (BACTROBAN) 2 % Apply topically 3 (three) times daily. 01/23/13   Darlyne Russian, MD  Neomycin-Polymyxin (ANTIBIOTIC EX) Apply 1 application topically daily as needed (Using for swelling and scarring to left eye.).    [provider]  oxyCODONE-acetaminophen (PERCOCET/ROXICET) 5-325 MG per tablet Take 2 tablets by mouth every 4 (four) hours as needed for severe pain. 03/09/15   Mabe, Forbes Cellar, MD  podofilox (CONDYLOX) 0.5 % gel Apply topically 2 (two) times daily. Apply topically twice a day for 3 days and then do not apply for 4 days and then reapply for 3 days off 4 days until lesions have resolved. You can complete up to 3 cycles 12/31/12   Darlyne Russian, MD  [facility] HAZEL EX Apply 1 application topically daily as needed (Swelling, and scarring.).    [provider]    Family History No family  history on file.  Social History Social History   Tobacco Use  . Smoking status: Current Every Day Smoker    Packs/day: 0.50  . Smokeless tobacco: Never Used  Substance Use Topics  . Alcohol use: Yes    Comment: socially  . Drug use: No     Allergies   Patient has no known allergies.   Review of Systems Review of Systems  All other systems reviewed and are negative.    Physical Exam Updated Vital Signs BP (!) 152/96 (BP Location: Right Arm)   Pulse 76   Temp 98.4 F (36.9 C) (Oral)   Resp 20   Ht 5' 6"  (1.676 m)   Wt 93 kg (205 lb)   SpO2 99%   BMI 33.09 kg/m   Physical Exam  Nursing note and vitals reviewed.  46 year old male, resting comfortably and in no  acute distress. Vital signs are significant for hypertension. Oxygen saturation is 99%, which is normal. Head is normocephalic and atraumatic. PERRLA, EOMI. Oropharynx is clear. Neck is nontender and supple without adenopathy or JVD. Back is nontender in the midline.  There is mild bilateral paraspinal spasm with some mild tenderness in the paraspinal areas.  There is no CVA tenderness. Lungs are clear without rales, wheezes, or rhonchi. Chest is nontender. Heart has regular rate and rhythm without murmur. Abdomen is soft, flat, nontender without masses or hepatosplenomegaly and peristalsis is normoactive. Genitalia: Circumcised penis.  Two large verrucous lesions in the shaft of the pain is consistent with condyloma acuminata.  Testes descended without masses.  No inguinal adenopathy.  No urethral discharge seen. Rectal: Two verrucous lesions consistent with condyloma acuminata. Extremities: Swelling and tenderness of the second and third MCP joints bilaterally.  Ulnar deviation of the fingers consistent with long-standing rheumatoid arthritis.  There is no cyanosis or edema. Skin is warm and dry without rash. Neurologic: Mental status is normal, cranial nerves are intact, there are no motor or sensory deficits.  ED Treatments / Results  Labs (all labs ordered are listed, but only abnormal results are displayed) Labs Reviewed  CBC WITH DIFFERENTIAL/PLATELET - Abnormal; Notable for the following components:      Result Value   WBC 11.1 (*)    All other components within normal limits  LIPASE, BLOOD  COMPREHENSIVE METABOLIC PANEL  URINALYSIS, ROUTINE W REFLEX MICROSCOPIC  SEDIMENTATION RATE  RPR  HIV ANTIBODY (ROUTINE TESTING)  GC/CHLAMYDIA PROBE AMP ([place]) NOT AT [facility]   Radiology Ct Abdomen Pelvis W Contrast  Result Date: 05/26/2017 CLINICAL DATA:  Worsening diffuse abdominal pain for approximately 1 year. EXAM: CT ABDOMEN AND PELVIS WITH CONTRAST TECHNIQUE: Multidetector CT  imaging of the abdomen and pelvis was performed using the standard protocol following bolus administration of intravenous contrast. CONTRAST:  167m ISOVUE-300 IOPAMIDOL (ISOVUE-300) INJECTION 61% COMPARISON:  None. FINDINGS: Lower Chest: No acute findings. Tiny left posterior diaphragmatic hernia containing only fat. Hepatobiliary: No hepatic masses identified. Gallbladder is unremarkable. Pancreas:  No mass or inflammatory changes. Spleen: No evidence of splenomegaly. 1.9 cm low-attenuation lesion in the superior aspect of the spleen is nonspecific, but most likely represents a benign hemangioma or lymphangioma. Adrenals/Urinary Tract: No masses identified. Tiny sub-cm cyst noted in upper pole of left kidney. No evidence of hydronephrosis. Unremarkable unopacified urinary bladder. Stomach/Bowel: No evidence of obstruction, inflammatory process or abnormal fluid collections. Normal appendix visualized. Vascular/Lymphatic: No pathologically enlarged lymph nodes. No abdominal aortic aneurysm. Reproductive:  No mass or other significant abnormality. Other:  None. Musculoskeletal:  No suspicious bone lesions identified. IMPRESSION: No acute findings. Small indeterminate low-attenuation splenic lesion, likely representing a benign hemangioma or lymphangioma. Recommend followup with abdomen MRI without and with contrast in 6 months to confirm stability. This recommendation follows ACR consensus guidelines: White Paper of the ACR Incidental Findings Committee II on Splenic and Nodal Findings. [place] [phone]. Electronically Signed   By: Earle Gell M.D.   On: 05/26/2017 19:01    Procedures Procedures (including critical care time)  Medications Ordered in ED Medications  ketorolac (TORADOL) 30 MG/ML injection 30 mg (30 mg Intravenous Given 05/26/17 1723)  iopamidol (ISOVUE-300) 61 % injection 100 mL (100 mLs Intravenous Contrast Given 05/26/17 1832)     Initial Impression / Assessment and Plan  / ED Course  I have reviewed the triage vital signs and the nursing notes.  Pertinent labs & imaging results that were available during my care of the patient were reviewed by me and considered in my medical decision making (see chart for details).  Multiple complaints.  Condyloma acuminata of the penis and perirectal area.  This appears to be stable.  Will screen for other STDs.  Back and joint complaints which certainly seem consistent with rheumatic conditions.  Consider HLA B 25-related arthritis syndrome, Reiter syndrome.  Consider other seronegative arthropathies.  With abdominal complaints, also consider possibility of underlying Crohn's disease with associated arthropathy.  Will check screening labs and get CT of abdomen and pelvis.  This will also allow good visualization of the lumbar spine.  Review of old records does show an office visit with general surgery for perianal condyloma acuminata.  ED workup is unremarkable except for lesion in the spleen which is probably hemangioma or lymphangioma.  Patient is advised of these findings.  He will be given symptomatic treatment.  He is given prescription for podofilox gel for inner genital warts, and celecoxib for arthritic pain.  Recommended he use over-the-counter loperamide for diarrhea and omeprazole for epigastric distress.  Advised of radiologist recommendation for follow-up MRI of abdomen with and without contrast in 6 months to make sure splenic lesion is not enlarging.  Final Clinical Impressions(s) / ED Diagnoses   Final diagnoses:  Abdominal pain, unspecified abdominal location  Chronic diarrhea  Chronic bilateral back pain, unspecified back location  Anogenital warts in male  Lesion of spleen    ED Discharge Orders        Ordered    podofilox (CONDYLOX) 0.5 % gel  2 times daily     05/26/17 1923    celecoxib (CELEBREX) 200 MG capsule  2 times daily     44/03/47 4259       Delora Fuel, MD 56/38/75 [phone]

## 2017-05-27 LAB — RPR: RPR Ser Ql: NONREACTIVE

## 2017-05-27 LAB — HIV ANTIBODY (ROUTINE TESTING W REFLEX): HIV Screen 4th Generation wRfx: NONREACTIVE

## 2017-05-28 LAB — GC/CHLAMYDIA PROBE AMP (~~LOC~~) NOT AT ARMC
CHLAMYDIA, DNA PROBE: NEGATIVE
Neisseria Gonorrhea: NEGATIVE

## 2017-12-02 ENCOUNTER — Ambulatory Visit: Payer: BLUE CROSS/BLUE SHIELD | Admitting: Family Medicine

## 2017-12-02 ENCOUNTER — Encounter: Payer: Self-pay | Admitting: Family Medicine

## 2017-12-02 VITALS — BP 120/72 | HR 61 | Temp 98.0°F | Ht 67.5 in | Wt 201.0 lb

## 2017-12-02 DIAGNOSIS — M79641 Pain in right hand: Secondary | ICD-10-CM | POA: Diagnosis not present

## 2017-12-02 DIAGNOSIS — M79642 Pain in left hand: Secondary | ICD-10-CM

## 2017-12-02 DIAGNOSIS — F329 Major depressive disorder, single episode, unspecified: Secondary | ICD-10-CM | POA: Diagnosis not present

## 2017-12-02 DIAGNOSIS — D7389 Other diseases of spleen: Secondary | ICD-10-CM

## 2017-12-02 DIAGNOSIS — D739 Disease of spleen, unspecified: Secondary | ICD-10-CM

## 2017-12-02 DIAGNOSIS — G8929 Other chronic pain: Secondary | ICD-10-CM | POA: Diagnosis not present

## 2017-12-02 DIAGNOSIS — M545 Low back pain: Secondary | ICD-10-CM | POA: Diagnosis not present

## 2017-12-02 DIAGNOSIS — M21839 Other specified acquired deformities of unspecified forearm: Secondary | ICD-10-CM | POA: Diagnosis not present

## 2017-12-02 DIAGNOSIS — F32A Depression, unspecified: Secondary | ICD-10-CM

## 2017-12-02 MED ORDER — SERTRALINE HCL 50 MG PO TABS
50.0000 mg | ORAL_TABLET | Freq: Every day | ORAL | 1 refills | Status: DC
Start: 2017-12-02 — End: 2018-01-17

## 2017-12-02 MED ORDER — MELOXICAM 7.5 MG PO TABS: 7.5000 mg | ORAL_TABLET | Freq: Every day | ORAL | 0 refills | Status: DC | PRN

## 2017-12-02 NOTE — Patient Instructions (Addendum)
Please call therapist to discuss depression symptoms, and start zoloft once per day for now.   Karmen Bongo: [phone] [place] Ped: [phone]  For back pain, I will refer you for physical therapy. Tylenol if needed, but if additional med needed for the back - I did write some meloxicam up to once per day. Do not take other NSAIDS with that medication.   I will refer you to your prior hand specialist for hand pain.  meloxicam may also help temporarily.   I ordered an MRI to recheck spleen abnormality.   Please follow up in next few weeks to discuss any other concerns that we did not address today. Return to the clinic or go to the nearest emergency room if any of your symptoms worsen or new symptoms occur.   IF you received an x-ray today, you will receive an invoice from [facility]. Please contact [facility] Radiology at [phone] with questions or concerns regarding your invoice.   IF you received labwork today, you will receive an invoice from [place]. Please contact [facility] at [phone] with questions or concerns regarding your invoice.   Our billing staff will not be able to assist you with questions regarding bills from these companies.  You will be contacted with the lab results as soon as they are available. The fastest way to get your results is to activate your My Chart account. Instructions are located on the last page of this paperwork. If you have not heard from [place] regarding the results in 2 weeks, please contact this office.

## 2017-12-02 NOTE — Progress Notes (Signed)
Subjective:  By signing my name below, I, Brad Gray, attest that this documentation has been prepared under the direction and in the presence of Wendie Agreste, MD Electronically Signed: Ladene Gray, ED Scribe 12/02/2017 at 9:44 AM.   Patient ID: Brad Gray, male    DOB: August 21, 1970, 47 y.o.   MRN: [id number]  Chief Complaint  Patient presents with  . Establish Care    Ongoing pain in hands and back are for a while   HPI Brad Gray is a 47 y.o. male who presents to Primary Care at [facility] to establish care. New pt to me. Presents with pain in hands and back. He has been treated in 2010 with back pain by Dr. Burnice Logan. Has also been seen for back pain in Nov 2018 in the ER. Treated with Celebrex at that time. - Pt reports back and hand pain for several yrs. He has noticed tilt in his hands for several yrs as well. He reports increased pain at the base of his fingers and with rotating with wrists. Pt has seen Dr. Amedeo Plenty ~8 yrs ago who suspected ulnar drift and carpal tunnel. He reports the most pain between in shoulder blades that occasionally radiates into his LEs with prolonged sitting. He states he used to lift weights but denies specific injury to back. Pt has not had PT and did not try Celebrex in 2018 as he did not have insurance. He is currently taking Advil and Tylenol prn for worsening back pain. L spine XR in 08/2014: mild degenerative changes, mild scoliosis, no acute findings. Denies unexplained weight loss, night sweats, fever, bladder/bowel incontinence, saddle anesthesia, weakness in LE. No h/o RA.  Spleen Abnormality Noted 05/26/17 on CT abdomen/pelvis. 1.9 cm superior aspect of spleen. Suspected benign hemangioma or lymphangioma. Recommended MRI with and without contrast in 6 months for stability.   Positive Depression Screening Pt denies h/o depression. However, pt states that he has felt down, hopeless, noticed decreased interest and difficulty sleeping over the  past 6 months due to recent events. States "I'm not happy and I know I'm not who I used to be". Also reports recent tension HAs. Denies SI. He has not met with a counselor. Depression screen PHQ 2/9 12/02/2017  Decreased Interest 3  Down, Depressed, Hopeless 1  PHQ - 2 Score 4  Altered sleeping 2  Tired, decreased energy 1  Change in appetite 2  Feeling bad or failure about yourself  3  Trouble concentrating 2  Moving slowly or fidgety/restless 2  Suicidal thoughts 0  PHQ-9 Score 16  Difficult doing work/chores Very difficult   GI Pt does report diarrhea after eating which he plans to make another appointment to discuss further. Denies blood in stools.  Patient Active Problem List   Diagnosis Date Noted  . Condyloma acuminata-perirectal 10/07/2011  . BACK PAIN 10/02/2008  . CONDYLOMA ACUMINATA, PENIS 06/14/2007  . ARTHRITIS 06/14/2007   Past Medical History:  Diagnosis Date  . Joint pain    Past Surgical History:  Procedure Laterality Date  . KNEE ARTHROSCOPY    . SHOULDER ARTHROSCOPY     No Known Allergies Prior to Admission medications   Medication Sig Start Date End Date Taking? Authorizing Provider  celecoxib (CELEBREX) 200 MG capsule Take 1 capsule (200 mg total) by mouth 2 (two) times daily. 65/78/46   Delora Fuel, MD  ibuprofen (ADVIL,MOTRIN) 200 MG tablet Take 200 mg by mouth every 6 (six) hours as needed.    [provider]  podofilox (CONDYLOX) 0.5 % external solution Apply topically 2 (two) times daily. Apply twice a day for three days, then none for four days. Repeat for up to four weeks until warts are gone. Do not use for more than four weeks. 42/68/34   Delora Fuel, MD   Social History   Socioeconomic History  . Marital status: Single    Spouse name: Not on file  . Number of children: Not on file  . Years of education: Not on file  . Highest education level: Not on file  Occupational History  . Not on file  Social Needs  . Financial  resource strain: Not on file  . Food insecurity:    Worry: Not on file    Inability: Not on file  . Transportation needs:    Medical: Not on file    Non-medical: Not on file  Tobacco Use  . Smoking status: Current Every Day Smoker    Packs/day: 0.50  . Smokeless tobacco: Never Used  Substance and Sexual Activity  . Alcohol use: Yes    Comment: socially  . Drug use: No  . Sexual activity: Yes    Birth control/protection: None  Lifestyle  . Physical activity:    Days per week: Not on file    Minutes per session: Not on file  . Stress: Not on file  Relationships  . Social connections:    Talks on phone: Not on file    Gets together: Not on file    Attends religious service: Not on file    Active member of club or organization: Not on file    Attends meetings of clubs or organizations: Not on file    Relationship status: Not on file  . Intimate partner violence:    Fear of current or ex partner: Not on file    Emotionally abused: Not on file    Physically abused: Not on file    Forced sexual activity: Not on file  Other Topics Concern  . Not on file  Social History Narrative  . Not on file   Review of Systems  Constitutional: Negative for diaphoresis, fever and unexpected weight change.  Gastrointestinal: Positive for diarrhea. Negative for blood in stool.  Genitourinary: Negative for enuresis.  Musculoskeletal: Positive for arthralgias and back pain.  Neurological: Positive for headaches. Negative for weakness and numbness.  Psychiatric/Behavioral: Positive for dysphoric mood and sleep disturbance. Negative for suicidal ideas.      Objective:   Physical Exam  Constitutional: He is oriented to person, place, and time. He appears well-developed and well-nourished. No distress.  HENT:  Head: Normocephalic and atraumatic.  Eyes: Conjunctivae and EOM are normal.  Neck: Neck supple. No tracheal deviation present.  Cardiovascular: Normal rate.  Pulmonary/Chest: Effort  normal. No respiratory distress.  Musculoskeletal: Normal range of motion.  Lumbar: No midline thoracic or lumbar spinal tenderness. Complains of discomfort in the paraspinal muscles of lumbar and thoracic spine. Slight decreased lateral flexion bilaterally. Equal rotation. Flexion to 80 degrees. Neg seated straight leg raise. Able to heel and toe walk without difficulty. Hands: Ulnar deviation at MCP bilaterally, most notable at 2nd and 3rd phalanges more so than laterally. Some ulnar deviation of thumb bilaterally  Wrists: Some discomfort with motion of wrists without soft tissue swelling.  Neurological: He is alert and oriented to person, place, and time. He displays no Babinski's sign on the right side. He displays no Babinski's sign on the left side.  Reflex Scores:  Patellar reflexes are 2+ on the right side and 2+ on the left side.      Achilles reflexes are 2+ on the right side and 2+ on the left side. Skin: Skin is warm and dry.  Psychiatric: He has a normal mood and affect. His behavior is normal.  Nursing note and vitals reviewed.  Vitals:   12/02/17 0926  BP: 120/72  Pulse: 61  Temp: 98 F (36.7 C)  TempSrc: Oral  SpO2: 99%  Weight: 201 lb (91.2 kg)  Height: 5' 7.5" (1.715 m)      Assessment & Plan:   Brad Gray is a 47 y.o. male Lesion of spleen - Plan: MR Abdomen W Wo Contrast  -Noted on prior abdominal imaging.  MRI ordered as a 63-monthfollow-up imaging recommended. Long-standing hand pain with ulnar drift. Ulnar drift, unspecified laterality - Plan: Ambulatory referral to Hand Surgery Bilateral hand pain - Plan: Ambulatory referral to Hand Surgery, meloxicam (MOBIC) 7.5 MG tablet  -Reportedly has had evaluation for possible rheumatoid arthritis that has been negative in the past.  We will initially refer him back to his previous hand specialist, but can discuss other testing/rheumatologic work-up if needed at next visit.  Mobic prescribed for both back and  hand pain temporarily for now.  Chronic low back pain, unspecified back pain laterality, with sciatica presence unspecified - Plan: Ambulatory referral to Physical Therapy, meloxicam (MOBIC) 7.5 MG tablet  -Long-standing low back pain, appears to be muscular/axial pain.  Does have some paraspinal discomfort without red flags on exam or history.  Repeat imaging was deferred at present.  Refer to physical therapy, short-term trial of meloxicam and recheck in the next few weeks.  Depression, unspecified depression type - Plan: sertraline (ZOLOFT) 50 MG tablet  - due to symptoms present for 6 months, suspect some component of depression.  Treatment options discussed, will start low-dose Zoloft with potential side effects/initial side effects discussed.  Also provided numbers for counseling.  Recheck status the next 3 to 4 weeks, sooner if needed.  He has some other non-urgent concerns and plans to schedule separate office visit to discuss those further.  RTC/ER precautions if any acute worsening of symptoms.  Meds ordered this encounter  Medications  . sertraline (ZOLOFT) 50 MG tablet    Sig: Take 1 tablet (50 mg total) by mouth daily.    Dispense:  30 tablet    Refill:  1  . meloxicam (MOBIC) 7.5 MG tablet    Sig: Take 1 tablet (7.5 mg total) by mouth daily as needed for pain.    Dispense:  30 tablet    Refill:  0   Patient Instructions   Please call therapist to discuss depression symptoms, and start zoloft once per day for now.   AVivia Budge [phone] [place]:[phone] For back pain, I will refer you for physical therapy. Tylenol if needed, but if additional med needed for the back - I did write some meloxicam up to once per day. Do not take other NSAIDS with that medication.   I will refer you to your prior hand specialist for hand pain.  meloxicam may also help temporarily.   I ordered an MRI to recheck spleen abnormality.   Please follow up in next few weeks to discuss  any other concerns that we did not address today. Return to the clinic or go to the nearest emergency room if any of your symptoms worsen or new symptoms occur.   IF  you received an x-ray today, you will receive an invoice from [facility]. Please contact [facility] at [phone] with questions or concerns regarding your invoice.   IF you received labwork today, you will receive an invoice from [place]. Please contact [facility] at [phone] with questions or concerns regarding your invoice.   Our billing staff will not be able to assist you with questions regarding bills from these companies.  You will be contacted with the lab results as soon as they are available. The fastest way to get your results is to activate your My Chart account. Instructions are located on the last page of this paperwork. If you have not heard from [place] regarding the results in 2 weeks, please contact this office.       I personally performed the services described in this documentation, which was scribed in my presence. The recorded information has been reviewed and considered for accuracy and completeness, addended by me as needed, and agree with information above.  Signed,   Merri Ray, MD [facility] at [place].  12/02/17 3:09 PM

## 2017-12-16 ENCOUNTER — Ambulatory Visit: Payer: BLUE CROSS/BLUE SHIELD | Admitting: Family Medicine

## 2017-12-16 ENCOUNTER — Encounter: Payer: Self-pay | Admitting: Family Medicine

## 2017-12-16 ENCOUNTER — Other Ambulatory Visit: Payer: Self-pay

## 2017-12-16 VITALS — BP 128/88 | HR 67 | Temp 98.7°F | Resp 16 | Ht 67.0 in | Wt 202.0 lb

## 2017-12-16 DIAGNOSIS — M20091 Other deformity of right finger(s): Secondary | ICD-10-CM | POA: Diagnosis not present

## 2017-12-16 DIAGNOSIS — G8929 Other chronic pain: Secondary | ICD-10-CM

## 2017-12-16 DIAGNOSIS — M545 Low back pain: Secondary | ICD-10-CM

## 2017-12-16 DIAGNOSIS — M79642 Pain in left hand: Secondary | ICD-10-CM

## 2017-12-16 DIAGNOSIS — M79641 Pain in right hand: Secondary | ICD-10-CM

## 2017-12-16 DIAGNOSIS — A630 Anogenital (venereal) warts: Secondary | ICD-10-CM | POA: Diagnosis not present

## 2017-12-16 DIAGNOSIS — M20092 Other deformity of left finger(s): Secondary | ICD-10-CM

## 2017-12-16 MED ORDER — MELOXICAM 7.5 MG PO TABS: 7.5000 mg | ORAL_TABLET | Freq: Every day | ORAL | 0 refills | Status: DC | PRN

## 2017-12-16 MED ORDER — IMIQUIMOD 5 % EX CREA: TOPICAL_CREAM | CUTANEOUS | 2 refills | Status: DC

## 2017-12-16 NOTE — Patient Instructions (Addendum)
IF you are having any return of depression symptoms I would recommend meeting with therapist or starting the Zoloft that was prescribed last visit.   Keep appointment with physical therapy as scheduled next week.  I would like to see if that helps your back pain, but okay to continue Mobic for now. Will need to look at other options if long term med needed.  I also referred you to a different hand surgeon, so should be receiving a phone call with that appointment in the next few weeks.  For genital rash, it does appear to be consistent with genital warts.  See information below on that condition.  Start the Aldara cream.  Apply the cream only to the genital warts 3 times per week until there is a total clearance of those areas or for a maximum of 16 weeks.  A thin layer should be applied to each wart and rub int the cream until no longer visible.  I would recommend applying that prior to your normal sleeping hours and leave on the skin for approximately 6 to 10 hours at the most.  Remove by washing that area with soap and water at that time. If you do have irritation of that skin you may need to have a rest period over several days, then resume treatment once the reaction has improved.   Return to the clinic or go to the nearest emergency room if any of your symptoms worsen or new symptoms occur.   Genital Warts Genital warts are a common STD (sexually transmitted disease). They may appear as small bumps on the tissues of the genital area or anal area. Sometimes, they can become irritated and cause pain. Genital warts are easily passed to other people through sexual contact. Getting treatment is important because genital warts can lead to other problems. In females, the virus that causes genital warts may increase the risk of cervical cancer. What are the causes? Genital warts are caused by a virus that is called human papillomavirus (HPV). HPV is spread by having unprotected sex with an infected  person. It can be spread through vaginal, anal, and oral sex. Many people do not know that they are infected. They may be infected for years without problems. However, even if they do not have problems, they can pass the infection to their sexual partners. What increases the risk? Genital warts are more likely to develop in:  People who have unprotected sex.  People who have multiple sexual partners.  People who become sexually active before they are 45 years of age.  Men who are not circumcised.  Women who have a male sexual partner who is not circumcised.  People who have a weakened body defense system (immune system) due to disease or medicine.  People who smoke.  What are the signs or symptoms? Symptoms of genital warts include:  Small growths in the genital area or anal area. These warts often grow in clusters.  Itching and irritation in the genital area or anal area.  Bleeding from the warts.  Painful sexual intercourse.  How is this diagnosed? Genital warts can usually be diagnosed from their appearance on the vagina, vulva, penis, perineum, anus, or rectum. Tests may also be done, such as:  Biopsy. A tissue sample is removed so it can be looked at under a microscope.  Colposcopy. In females, a magnifying tool is used to examine the vagina and cervix. Certain solutions may be used to make the HPV cells change color so they  can be seen more easily.  A Pap test in females.  Tests for other STDs.  How is this treated? Treatment for genital warts may include:  Applying prescription medicines to the warts. These may be solutions or creams.  Freezing the warts with liquid nitrogen (cryotherapy).  Burning the warts with: ? Laser treatment. ? An electrified probe (electrocautery).  Injecting a substance (Candida antigen or Trichophyton antigen) into the warts to help the body's immune system to fight off the warts.  Interferon injections.  Surgery to remove the  warts.  Follow these instructions at home: Medicines  Apply over-the-counter and prescription medicines only as told by your health care provider.  Do not treat genital warts with medicines that are used for treating hand warts.  Talk with your health care provider about using over-the-counter anti-itch creams. General instructions  Do not touch or scratch the warts.  Do not have sex until your treatment has been completed.  Tell your current and past sexual partners about your condition because they may also need treatment.  Keep all follow-up visits as told by your health care provider. This is important.  After treatment, use condoms during sex to prevent future infections. Other Instructions for Women  Women who have genital warts might need increased screening for cervical cancer. This type of cancer is slow growing and can be cured if it is found early. Chances of developing cervical cancer are increased with HPV.  If you become pregnant, tell your health care provider that you have had HPV. Your health care provider will monitor you closely during pregnancy to be sure that your baby is safe. How is this prevented? Talk with your health care provider about getting the HPV vaccines. These vaccines prevent some HPV infections and cancers. It is recommended that the vaccine be given to males and females who are 54-39 years of age. It will not work if you already have HPV, and it is not recommended for pregnant women. Contact a health care provider if:  You have redness, swelling, or pain in the area of the treated skin.  You have a fever.  You feel generally ill.  You feel lumps in and around your genital area or anal area.  You have bleeding in your genital area or anal area.  You have pain during sexual intercourse. This information is not intended to replace advice given to you by your health care provider. Make sure you discuss any questions you have with your health  care provider. Document Released: 06/19/2000 Document Revised: 11/28/2015 Document Reviewed: 09/17/2014 Elsevier Interactive Patient Education  2018 [employer].   IF you received an x-ray today, you will receive an invoice from [facility]. Please contact [facility] at [phone] with questions or concerns regarding your invoice.   IF you received labwork today, you will receive an invoice from [place]. Please contact [facility] at [phone] with questions or concerns regarding your invoice.   Our billing staff will not be able to assist you with questions regarding bills from these companies.  You will be contacted with the lab results as soon as they are available. The fastest way to get your results is to activate your My Chart account. Instructions are located on the last page of this paperwork. If you have not heard from [place] regarding the results in 2 weeks, please contact this office.

## 2017-12-16 NOTE — Progress Notes (Deleted)
Hx of genital warts on both penis and around the anus for over a year. Had been treated with laser at dermatology for penile lesions, but lesions returned.  Tried a cream that helped, but it had caused some rawness and peeling of scrotum. Thinks he was taking it daily - maybe using too frequently.

## 2017-12-16 NOTE — Progress Notes (Signed)
Subjective:  By signing my name below, I, Brad Gray, attest that this documentation has been prepared under the direction and in the presence of Brad Flood, MD. Electronically Signed: Diona Gray, Medical Scribe 12/16/17 at 1:31 PM.   Patient ID: Brad Gray, male    DOB: March 08, 1971, 47 y.o.   MRN: [id number]  Chief Complaint  Patient presents with  . Back Pain    x 2wks follow up on hands and depression   . Hand Pain    HPI Brad Gray is a 47 y.o. male who presents to Primary Care at [facility] for a follow up after his last visit on 12/02/2017.   1) Hand pain with Ulnar Drift He was referred back to hand specialist. He was temporarily prescribed Rx Mobic. He reported negative rheumatoid arthritis testing in the past. He was unable to see the specialist until his insurance is paid and would like a referral to another practice. He continues to have pain in bilateral hands.   2) Chronic Low back pain He was referred to phycial therapy and was also prescribed Rx Mobic last visit. Muscular and Axial. He starts physical therapy next Friday, 12/24/2017 and has an MRI on Thursday, 12/23/2017. He missed taking his Mobic prescription one day, but was able to see that the medication was working for him.   3) Depression See last office visit, treatment options discussed. Started on Zoloft 50 mg QD and given numbers for counseling. He has been doing well. Since his last visit he decided not to take this medication. He does not believe he is depressed and felt he was going through naturally hard time.   4) Genital warts Hx of genital warts on both penis and around the anus for over a year. Had been treated with laser at dermatology for penile lesions, but lesions returned.  Tried a cream that helped, but it had caused some rawness and peeling of scrotum. Thinks he was taking it daily - maybe using too frequently.    Patient Active Problem List   Diagnosis Date Noted  . Condyloma  acuminata-perirectal 10/07/2011  . BACK PAIN 10/02/2008  . CONDYLOMA ACUMINATA, PENIS 06/14/2007  . ARTHRITIS 06/14/2007   Past Medical History:  Diagnosis Date  . Joint pain    Past Surgical History:  Procedure Laterality Date  . KNEE ARTHROSCOPY    . SHOULDER ARTHROSCOPY     No Known Allergies Prior to Admission medications   Medication Sig Start Date End Date Taking? Authorizing Provider  ibuprofen (ADVIL,MOTRIN) 200 MG tablet Take 200 mg by mouth every 6 (six) hours as needed.   Yes [provider]  meloxicam (MOBIC) 7.5 MG tablet Take 1 tablet (7.5 mg total) by mouth daily as needed for pain. 12/02/17  Yes Brad Flood, MD  sertraline (ZOLOFT) 50 MG tablet Take 1 tablet (50 mg total) by mouth daily. 12/02/17  Yes Brad Flood, MD   Social History   Socioeconomic History  . Marital status: Single    Spouse name: Not on file  . Number of children: Not on file  . Years of education: Not on file  . Highest education level: Not on file  Occupational History  . Not on file  Social Needs  . Financial resource strain: Not on file  . Food insecurity:    Worry: Not on file    Inability: Not on file  . Transportation needs:    Medical: Not on file    Non-medical:  Not on file  Tobacco Use  . Smoking status: Current Every Day Smoker    Packs/day: 0.50  . Smokeless tobacco: Never Used  Substance and Sexual Activity  . Alcohol use: Yes    Comment: socially  . Drug use: No  . Sexual activity: Yes    Birth control/protection: None  Lifestyle  . Physical activity:    Days per week: Not on file    Minutes per session: Not on file  . Stress: Not on file  Relationships  . Social connections:    Talks on phone: Not on file    Gets together: Not on file    Attends religious service: Not on file    Active member of club or organization: Not on file    Attends meetings of clubs or organizations: Not on file    Relationship status: Not on file  . Intimate  partner violence:    Fear of current or ex partner: Not on file    Emotionally abused: Not on file    Physically abused: Not on file    Forced sexual activity: Not on file  Other Topics Concern  . Not on file  Social History Narrative  . Not on file     Review of Systems  Constitutional: Negative for chills and fever.  Musculoskeletal: Positive for back pain.       +bilateral hand pain  Skin: Positive for rash.  All other systems reviewed and are negative.      Objective:   Physical Exam  Constitutional: He is oriented to person, place, and time. He appears well-developed and well-nourished. No distress.  HENT:  Head: Normocephalic and atraumatic.  Cardiovascular: Normal rate.  Pulmonary/Chest: Effort normal.  Genitourinary:     Genitourinary Comments: Dorsal left side of penile shaft two large condylomas approximately 1 cm across with smaller 5 mm lesion distally on the penile shaft. under surface another lesion 8mm appear to be condyloma.   Musculoskeletal:  Ulnar deviation of all fingers primarily ulnar aspect with some flection at MCP of thumbs. Skin is intact.  Neurological: He is alert and oriented to person, place, and time.  Psychiatric: He has a normal mood and affect.  Vitals reviewed.     Vitals:   12/16/17 1327  BP: 128/88  Pulse: 67  Resp: 16  Temp: 98.7 F (37.1 C)  TempSrc: Oral  SpO2: 99%  Weight: 202 lb (91.6 kg)  Height: 5\' 7"  (1.702 m)          Assessment & Plan:    Brad Gray is a 47 y.o. 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  Continue meloxicam for now.  Chronic low back pain, unspecified back pain laterality, with sciatica presence unspecified - Plan: meloxicam (MOBIC) 7.5 MG tablet  -Physical therapy pending.   Refilled meloxicam for now.  Plan on recheck once he has been in physical therapy for a few visits. Multiple scattered anogenital warts as above. Anogenital warts - Plan: imiquimod (ALDARA) 5 % cream  -He may have been using medication too frequently in the past that caused irritation, but potential side effects of imiquimod cream were discussed as well as handout given on appropriate dosing and application.  Consider dermatology eval if unable to resolve within 16 weeks of treatment.  Meds  ordered this encounter  Medications  . meloxicam (MOBIC) 7.5 MG tablet    Sig: Take 1 tablet (7.5 mg total) by mouth daily as needed for pain.    Dispense:  30 tablet    Refill:  0  . imiquimod (ALDARA) 5 % cream    Sig: Apply topically 3 (three) times a week.    Dispense:  24 each    Refill:  2   Patient Instructions    IF you are having any return of depression symptoms I would recommend meeting with therapist or starting the Zoloft that was prescribed last visit.   Keep appointment with physical therapy as scheduled next week.  I would like to see if that helps your back pain, but okay to continue Mobic for now. Will need to look at other options if long term med needed.  I also referred you to a different hand surgeon, so should be receiving a phone call with that appointment in the next few weeks.  For genital rash, it does appear to be consistent with genital warts.  See information below on that condition.  Start the Aldara cream.  Apply the cream only to the genital warts 3 times per week until there is a total clearance of those areas or for a maximum of 16 weeks.  A thin layer should be applied to each wart and rub int the cream until no longer visible.  I would recommend applying that prior to your normal sleeping hours and leave on the skin for approximately 6 to 10 hours at the most.  Remove by washing that area with soap and water at that time. If you do have irritation of that skin you may  need to have a rest period over several days, then resume treatment once the reaction has improved.   Return to the clinic or go to the nearest emergency room if any of your symptoms worsen or new symptoms occur.   Genital Warts Genital warts are a common STD (sexually transmitted disease). They may appear as small bumps on the tissues of the genital area or anal area. Sometimes, they can become irritated and cause pain. Genital warts are easily passed to other people through sexual contact. Getting treatment is important because genital warts can lead to other problems. In females, the virus that causes genital warts may increase the risk of cervical cancer. What are the causes? Genital warts are caused by a virus that is called human papillomavirus (HPV). HPV is spread by having unprotected sex with an infected person. It can be spread through vaginal, anal, and oral sex. Many people do not know that they are infected. They may be infected for years without problems. However, even if they do not have problems, they can pass the infection to their sexual partners. What increases the risk? Genital warts are more likely to develop in:  People who have unprotected sex.  People who have multiple sexual partners.  People who become sexually active before they are 45 years of age.  Men who are not circumcised.  Women who have a male sexual partner who is not circumcised.  People who have a weakened body defense system (immune system) due to disease or medicine.  People who smoke.  What are the signs or symptoms? Symptoms of genital warts include:  Small growths in the genital area or anal area. These warts often grow in clusters.  Itching and irritation in the genital area or anal area.  Bleeding from the  warts.  Painful sexual intercourse.  How is this diagnosed? Genital warts can usually be diagnosed from their appearance on the vagina, vulva, penis, perineum, anus, or rectum.  Tests may also be done, such as:  Biopsy. A tissue sample is removed so it can be looked at under a microscope.  Colposcopy. In females, a magnifying tool is used to examine the vagina and cervix. Certain solutions may be used to make the HPV cells change color so they can be seen more easily.  A Pap test in females.  Tests for other STDs.  How is this treated? Treatment for genital warts may include:  Applying prescription medicines to the warts. These may be solutions or creams.  Freezing the warts with liquid nitrogen (cryotherapy).  Burning the warts with: ? Laser treatment. ? An electrified probe (electrocautery).  Injecting a substance (Candida antigen or Trichophyton antigen) into the warts to help the body's immune system to fight off the warts.  Interferon injections.  Surgery to remove the warts.  Follow these instructions at home: Medicines  Apply over-the-counter and prescription medicines only as told by your health care provider.  Do not treat genital warts with medicines that are used for treating hand warts.  Talk with your health care provider about using over-the-counter anti-itch creams. General instructions  Do not touch or scratch the warts.  Do not have sex until your treatment has been completed.  Tell your current and past sexual partners about your condition because they may also need treatment.  Keep all follow-up visits as told by your health care provider. This is important.  After treatment, use condoms during sex to prevent future infections. Other Instructions for Women  Women who have genital warts might need increased screening for cervical cancer. This type of cancer is slow growing and can be cured if it is found early. Chances of developing cervical cancer are increased with HPV.  If you become pregnant, tell your health care provider that you have had HPV. Your health care provider will monitor you closely during pregnancy to be  sure that your baby is safe. How is this prevented? Talk with your health care provider about getting the HPV vaccines. These vaccines prevent some HPV infections and cancers. It is recommended that the vaccine be given to males and females who are 16-35 years of age. It will not work if you already have HPV, and it is not recommended for pregnant women. Contact a health care provider if:  You have redness, swelling, or pain in the area of the treated skin.  You have a fever.  You feel generally ill.  You feel lumps in and around your genital area or anal area.  You have bleeding in your genital area or anal area.  You have pain during sexual intercourse. This information is not intended to replace advice given to you by your health care provider. Make sure you discuss any questions you have with your health care provider. Document Released: 06/19/2000 Document Revised: 11/28/2015 Document Reviewed: 09/17/2014 Elsevier Interactive Patient Education  2018 [employer].   IF you received an x-ray today, you will receive an invoice from [facility]. Please contact [facility] at [phone] with questions or concerns regarding your invoice.   IF you received labwork today, you will receive an invoice from [place]. Please contact [facility] at [phone] with questions or concerns regarding your invoice.   Our billing staff will not be able to assist you with questions regarding bills from these  companies.  You will be contacted with the lab results as soon as they are available. The fastest way to get your results is to activate your My Chart account. Instructions are located on the last page of this paperwork. If you have not heard from us regarding the results in 2 weeks, please contact this office.       I personally performed the services described in this documentation, which was scribed in my presence. The recorded information has been reviewed and considered  for accuracy and completeness, addended by me as needed, and agree with information above.  Signed,   Meredith StaggersJeffrey Lorraine Cimmino, MD Primary Care at [facility].  12/16/17 2:05 PM

## 2017-12-17 ENCOUNTER — Telehealth: Payer: Self-pay | Admitting: Family Medicine

## 2017-12-17 NOTE — Telephone Encounter (Signed)
Copied from CRM [phone]. Topic: Quick Communication - Rx Refill/Question >> Dec 17, 2017  1:51 PM Alexander BergeronBarksdale, Brad B wrote: Medication: imiquimod (ALDARA) 5 % cream [[id number]][[id number]]   Pt called about medication above, the insurance notified pt that a prior Berkley Harveyauth is needed, contact [phone]

## 2017-12-18 ENCOUNTER — Telehealth: Payer: Self-pay | Admitting: Family Medicine

## 2017-12-18 NOTE — Telephone Encounter (Signed)
Pt has been scheduled with [facility] on 12/24/17 at 11AM

## 2017-12-19 NOTE — Telephone Encounter (Signed)
[facility] re: PA

## 2017-12-20 NOTE — Telephone Encounter (Signed)
Pa [id number]   12/20/2017

## 2017-12-22 NOTE — Telephone Encounter (Signed)
Paperwork put in box to be filled out and signed

## 2017-12-22 NOTE — Telephone Encounter (Signed)
Brad Gray [place] from [employer] stated the prior auth for the medication imiquimod [facility](ALDARA) was sent on the incorrect form. Brad Gray is going to fax over a new form and for this to be completed and faxed back. Fax # [phone].

## 2017-12-23 ENCOUNTER — Telehealth: Payer: Self-pay | Admitting: Family Medicine

## 2017-12-23 ENCOUNTER — Ambulatory Visit
Admission: RE | Admit: 2017-12-23 | Discharge: 2017-12-23 | Disposition: A | Discharge status: Home / Self Care | Payer: BLUE CROSS/BLUE SHIELD | Source: Ambulatory Visit | Attending: Family Medicine | Admitting: Family Medicine

## 2017-12-23 DIAGNOSIS — D739 Disease of spleen, unspecified: Principal | ICD-10-CM

## 2017-12-23 DIAGNOSIS — D7389 Other diseases of spleen: Secondary | ICD-10-CM

## 2017-12-23 MED ORDER — GADOBENATE DIMEGLUMINE 529 MG/ML IV SOLN
19.0000 mL | Freq: Once | INTRAVENOUS | Status: AC | PRN
  Administered 2017-12-23: 19 mL via INTRAVENOUS

## 2017-12-23 NOTE — Telephone Encounter (Signed)
[employer] has approved MRI of abd before and after contrast  Auth: [id number]

## 2017-12-23 NOTE — Telephone Encounter (Signed)
Forwarded message re: resubmit PA to [place]

## 2017-12-23 NOTE — Telephone Encounter (Signed)
Liborio NixonJanice calling to have PA on imiquimod (ALDARA) 5 % cream resubmitted. The request through cover my meds did not populate the correct questions.   She is faxing the request now ATTN: Corine ShelterJulie G.

## 2017-12-24 NOTE — Telephone Encounter (Signed)
Liborio NixonJanice calling back again because they did not receive the correct information to approve this med. They need questions answered. The quickest way is to call Liborio NixonJanice at [phone]  Ext [phone]  Liborio NixonJanice is going to refax now.

## 2017-12-27 NOTE — Telephone Encounter (Signed)
Left message to call back ask for julie

## 2017-12-31 NOTE — Telephone Encounter (Signed)
Brad Gray with pt's insurance [employer] called in to discuss PA for medication. She is requesting a call back at: [phone] (her direct ext)

## 2017-12-31 NOTE — Telephone Encounter (Signed)
Spoke with Janine LimboKiara she resubmitting with new information

## 2018-01-10 ENCOUNTER — Encounter: Payer: Self-pay | Admitting: Neurology

## 2018-01-11 ENCOUNTER — Other Ambulatory Visit: Payer: Self-pay | Admitting: Neurology

## 2018-01-11 DIAGNOSIS — M79601 Pain in right arm: Secondary | ICD-10-CM

## 2018-01-11 DIAGNOSIS — M79602 Pain in left arm: Principal | ICD-10-CM

## 2018-01-11 DIAGNOSIS — R202 Paresthesia of skin: Principal | ICD-10-CM

## 2018-01-17 ENCOUNTER — Ambulatory Visit: Payer: BLUE CROSS/BLUE SHIELD | Admitting: Family Medicine

## 2018-01-17 ENCOUNTER — Encounter: Payer: Self-pay | Admitting: Family Medicine

## 2018-01-17 ENCOUNTER — Other Ambulatory Visit: Payer: Self-pay

## 2018-01-17 DIAGNOSIS — G8929 Other chronic pain: Secondary | ICD-10-CM

## 2018-01-17 DIAGNOSIS — M545 Low back pain: Secondary | ICD-10-CM

## 2018-01-17 DIAGNOSIS — A630 Anogenital (venereal) warts: Secondary | ICD-10-CM | POA: Diagnosis not present

## 2018-01-17 MED ORDER — IMIQUIMOD 5 % EX CREA: TOPICAL_CREAM | CUTANEOUS | 2 refills | Status: DC

## 2018-01-17 MED ORDER — MELOXICAM 7.5 MG PO TABS: 7.5000 mg | ORAL_TABLET | Freq: Every day | ORAL | 0 refills | Status: DC | PRN

## 2018-01-17 NOTE — Progress Notes (Signed)
Subjective:  By signing my name below, I, Stann Ore, attest that this documentation has been prepared under the direction and in the presence of Meredith Staggers, MD. Electronically Signed: Stann Ore, Scribe. 01/17/2018 , 2:09 PM .  Patient was seen in Room 11 .   Patient ID: Brad Gray, male    DOB: 27-Mar-1971, 47 y.o.   MRN: [id number] Chief Complaint  Patient presents with  . Back Pain    f/u   HPI Brad Gray is a 47 y.o. male Here for follow up of back pain. He's had a history of chronic low back pain, thought to be axial/muscular pain. He was treated with mobic previously and physical therapy starting on June 21st. Of note, his MRI was for abdomen in June, with an abnormality of the spleen that was stable. Plan for repeat MRI in 6 months.   He hasn't gone to PT yet as he had to reschedule at that time, until this coming Thursday (July 18th) at 1:15 PM. He's still taking meloxicam qd, but back pain has been about the same. When he first took meloxicam for the first and second week, he felt better initially but the improvement started to fade off. He denies bowel or urine incontinence, or back pain. He is down to about 6-7 pills of meloxicam, and would like a refill. He's been taking meloxicam once a day.   He was prescribed Aldara cream last visit for genital warts but he informs insurance not covering. They will only cover for 12 packets instead of the 24 packets.   Patient Active Problem List   Diagnosis Date Noted  . Condyloma acuminata-perirectal 10/07/2011  . BACK PAIN 10/02/2008  . CONDYLOMA ACUMINATA, PENIS 06/14/2007  . ARTHRITIS 06/14/2007   Past Medical History:  Diagnosis Date  . Joint pain    Past Surgical History:  Procedure Laterality Date  . KNEE ARTHROSCOPY    . SHOULDER ARTHROSCOPY     No Known Allergies Prior to Admission medications   Medication Sig Start Date End Date Taking? Authorizing Provider  ibuprofen (ADVIL,MOTRIN) 200 MG tablet  Take 200 mg by mouth every 6 (six) hours as needed.    [provider]  imiquimod (ALDARA) 5 % cream Apply topically 3 (three) times a week. 12/17/17   [name], MD  meloxicam (MOBIC) 7.5 MG tablet Take 1 tablet (7.5 mg total) by mouth daily as needed for pain. 12/16/17   [name], MD  sertraline (ZOLOFT) 50 MG tablet Take 1 tablet (50 mg total) by mouth daily. 12/02/17   [name], MD   Social History   Socioeconomic History  . Marital status: Single    Spouse name: Not on file  . Number of children: Not on file  . Years of education: Not on file  . Highest education level: Not on file  Occupational History  . Not on file  Social Needs  . Financial resource strain: Not on file  . Food insecurity:    Worry: Not on file    Inability: Not on file  . Transportation needs:    Medical: Not on file    Non-medical: Not on file  Tobacco Use  . Smoking status: Current Every Day Smoker    Packs/day: 0.50  . Smokeless tobacco: Never Used  Substance and Sexual Activity  . Alcohol use: Yes    Comment: socially  . Drug use: No  . Sexual activity: Yes    Birth control/protection: None  Lifestyle  . Physical activity:    Days per week: Not on file    Minutes per session: Not on file  . Stress: Not on file  Relationships  . Social connections:    Talks on phone: Not on file    Gets together: Not on file    Attends religious service: Not on file    Active member of club or organization: Not on file    Attends meetings of clubs or organizations: Not on file    Relationship status: Not on file  . Intimate partner violence:    Fear of current or ex partner: Not on file    Emotionally abused: Not on file    Physically abused: Not on file    Forced sexual activity: Not on file  Other Topics Concern  . Not on file  Social History Narrative  . Not on file   Review of Systems  Constitutional: Negative for fatigue and unexpected weight change.  Eyes:  Negative for visual disturbance.  Respiratory: Negative for cough, chest tightness and shortness of breath.   Cardiovascular: Negative for chest pain, palpitations and leg swelling.  Gastrointestinal: Negative for abdominal pain and blood in stool.  Musculoskeletal: Positive for back pain.  Neurological: Negative for dizziness, light-headedness and headaches.       Objective:   Physical Exam  Constitutional: He is oriented to person, place, and time. He appears well-developed and well-nourished. No distress.  HENT:  Head: Normocephalic and atraumatic.  Eyes: Pupils are equal, round, and reactive to light. EOM are normal.  Neck: Neck supple.  Cardiovascular: Normal rate.  Pulmonary/Chest: Effort normal. No respiratory distress.  Musculoskeletal: Normal range of motion.  Tender to palpation of paraspinals of thoracic and lumbar spine  Neurological: He is alert and oriented to person, place, and time.  Skin: Skin is warm and dry.  Psychiatric: He has a normal mood and affect. His behavior is normal.  Nursing note and vitals reviewed.   Vitals:   01/17/18 1332  BP: 132/76  Pulse: 72  Temp: 98.5 F (36.9 C)  TempSrc: Oral  SpO2: 98%  Weight: 203 lb 12.8 oz (92.4 kg)  Height: 5\' 6"  (1.676 m)       Assessment & Plan:   [name] is a 47 y.o. male Anogenital warts - Plan: imiquimod (ALDARA) 5 % cream  -Medication not covered for 24 packets.  We will resubmit at 12 packets with 2 refills.  See details of diagnosis and exam from last visit.  Chronic low back pain, unspecified back pain laterality, with sciatica presence unspecified - Plan: meloxicam (MOBIC) 7.5 MG tablet  -Persistent low back pain, paraspinal muscles as well as mid/upper back region and paraspinal muscles.  Suspect physical therapy will be helpful, and encouraged to keep follow-up for appointment later this week.  Follow-up after approximately 1 month of treatment to determine if orthopedic evaluation  needed, sooner if worse  -Okay to continue meloxicam if that helps for now.   Meds ordered this encounter  Medications  . imiquimod (ALDARA) 5 % cream    Sig: Apply topically 3 (three) times a week.    Dispense:  12 each    Refill:  2  . meloxicam (MOBIC) 7.5 MG tablet    Sig: Take 1 tablet (7.5 mg total) by mouth daily as needed for pain.    Dispense:  30 tablet    Refill:  0   Patient Instructions    Keep appointment with physical  therapy this week. Ok to continue meloxicam if it is helping, but I would like to see how physical therapy does after a few visits.  Follow-up with me in the next 1 month to 6 weeks at the most.  I resent the Aldara cream to see if it will be covered at that dose.  Let me know if it is not covered and I can have our prior authorizations staff help look into cause.   Thanks for coming in today.    IF you received an x-ray today, you will receive an invoice from [facility]. Please contact [facility] at [phone] with questions or concerns regarding your invoice.   IF you received labwork today, you will receive an invoice from [facility]. Please contact [facility] at [phone] with questions or concerns regarding your invoice.   Our billing staff will not be able to assist you with questions regarding bills from these companies.  You will be contacted with the lab results as soon as they are available. The fastest way to get your results is to activate your My Chart account. Instructions are located on the last page of this paperwork. If you have not heard from [place] regarding the results in 2 weeks, please contact this office.      I personally performed the services described in this documentation, which was scribed in my presence. The recorded information has been reviewed and considered for accuracy and completeness, addended by me as needed, and agree with information above.  Signed,   Meredith Staggers, MD Primary Care at  [facility].  01/17/18 2:17 PM

## 2018-01-17 NOTE — Patient Instructions (Addendum)
  Keep appointment with physical therapy this week. Ok to continue meloxicam if it is helping, but I would like to see how physical therapy does after a few visits.  Follow-up with me in the next 1 month to 6 weeks at the most.  I resent the Aldara cream to see if it will be covered at that dose.  Let me know if it is not covered and I can have our prior authorizations staff help look into cause.   Thanks for coming in today.    IF you received an x-ray today, you will receive an invoice from [facility]. Please contact [facility] at [phone] with questions or concerns regarding your invoice.   IF you received labwork today, you will receive an invoice from [facility]. Please contact [facility] at [phone] with questions or concerns regarding your invoice.   Our billing staff will not be able to assist you with questions regarding bills from these companies.  You will be contacted with the lab results as soon as they are available. The fastest way to get your results is to activate your My Chart account. Instructions are located on the last page of this paperwork. If you have not heard from us regarding the results in 2 weeks, please contact this office.

## 2018-01-27 ENCOUNTER — Ambulatory Visit (INDEPENDENT_AMBULATORY_CARE_PROVIDER_SITE_OTHER): Payer: BLUE CROSS/BLUE SHIELD | Admitting: Neurology

## 2018-01-27 DIAGNOSIS — R202 Paresthesia of skin: Secondary | ICD-10-CM | POA: Diagnosis not present

## 2018-01-27 DIAGNOSIS — M79601 Pain in right arm: Secondary | ICD-10-CM

## 2018-01-27 DIAGNOSIS — M79602 Pain in left arm: Secondary | ICD-10-CM | POA: Diagnosis not present

## 2018-01-27 DIAGNOSIS — G5620 Lesion of ulnar nerve, unspecified upper limb: Secondary | ICD-10-CM

## 2018-01-27 DIAGNOSIS — G5603 Carpal tunnel syndrome, bilateral upper limbs: Secondary | ICD-10-CM

## 2018-01-27 NOTE — Procedures (Signed)
[facility]  [address], [address]  [place], [place] [address] Tel: [phone] Fax:  [phone] Test Date:  01/27/2018  Patient: Brad Gray DOB: [birth year] Physician: Nita Sickle, DO  Sex: Male Height: 5\' 6"  Ref Phys: Teryl Lucy, MD  ID#: [id number] Temp: 33.4C Technician:    Patient Complaints: This is a 47 year old man with congenital hand deformity referred for evaluation of bilateral hand pain and numbness.  NCV & EMG Findings: Extensive electrodiagnostic testing of the right upper extremity and additional studies of the left shows:  1. Bilateral mixed palmar sensory responses show prolonged latency. Bilateral median and ulnar sensory responses are within normal limits. 2. Bilateral median motor responses are within normal limits. Bilateral ulnar motor responses show mildly slowed conduction velocity across the elbow (A Elbow-B Elbow, R45, L48 m/s).  3. There is no evidence of active or chronic motor axon loss changes affecting any of the tested muscles. Motor unit configuration and recruitment pattern is within normal limits.  Impression: 1. Bilateral median neuropathy at or distal to the wrist, consistent with the clinical diagnosis of carpal tunnel syndrome, which is symmetric and very mild in degree electrically. 2. Bilateral ulnar neuropathy with slowing across the elbow, purely demyelinating in type, and mild in degree electrically.   ___________________________ Nita Sickle, DO    Nerve Conduction Studies Anti Sensory Summary Table   Site NR Peak (ms) Norm Peak (ms) P-T Amp (V) Norm P-T Amp  Left Median Anti Sensory (2nd Digit)  33.4C  Wrist    2.9 <3.4 22.2 >20  Right Median Anti Sensory (2nd Digit)  Wrist    3.2 <3.4 20.2 >20  Left Ulnar Anti Sensory (5th Digit)  33.4C  Wrist    2.8 <3.1 21.9 >12  Right Ulnar Anti Sensory (5th Digit)  Wrist    2.8 <3.1 14.9 >12   Motor Summary Table   Site NR Onset (ms) Norm Onset (ms) O-P Amp  (mV) Norm O-P Amp Site1 Site2 Delta-0 (ms) Dist (cm) Vel (m/s) Norm Vel (m/s)  Left Median Motor (Abd Poll Brev)  33.4C  Wrist    3.0 <3.9 9.8 >6 Elbow Wrist 5.0 29.0 58 >50  Elbow    8.0  9.5         Right Median Motor (Abd Poll Brev)  Wrist    3.4 <3.9 11.0 >6 Elbow Wrist 4.6 30.0 65 >50  Elbow    8.0  9.9         Left Ulnar Motor (Abd Dig Minimi)  33.4C  Wrist    2.3 <3.1 10.8 >7 B Elbow Wrist 3.7 23.0 62 >50  B Elbow    6.0  10.8  A Elbow B Elbow 2.1 10.0 48 >50  A Elbow    8.1  9.6         Right Ulnar Motor (Abd Dig Minimi)  Wrist    2.4 <3.1 11.5 >7 B Elbow Wrist 3.4 23.0 68 >50  B Elbow    5.8  10.1  A Elbow B Elbow 2.2 10.0 45 >50  A Elbow    8.0  9.9          Comparison Summary Table   Site NR Peak (ms) Norm Peak (ms) P-T Amp (V) Site1 Site2 Delta-P (ms) Norm Delta (ms)  Left Median/Ulnar Palm Comparison (Wrist - 8cm)  33.4C  Median Palm    1.7 <2.2 39.2 Median Palm Ulnar Palm 0.4   Ulnar Palm    1.3 <2.2 5.8  Right Median/Ulnar Palm Comparison (Wrist - 8cm)  Median Palm    1.8 <2.2 24.8 Median Palm Ulnar Palm 0.5   Ulnar Palm    1.3 <2.2 6.3       EMG   Side Muscle Ins Act Fibs Psw Fasc Number Recrt Dur Dur. Amp Amp. Poly Poly. Comment  Right 1stDorInt Nml Nml Nml Nml Nml Nml Nml Nml Nml Nml Nml Nml N/A  Right Biceps Nml Nml Nml Nml Nml Nml Nml Nml Nml Nml Nml Nml N/A  Right PronatorTeres Nml Nml Nml Nml Nml Nml Nml Nml Nml Nml Nml Nml N/A  Right Abd Poll Brev Nml Nml Nml Nml Nml Nml Nml Nml Nml Nml Nml Nml N/A  Right Triceps Nml Nml Nml Nml Nml Nml Nml Nml Nml Nml Nml Nml N/A  Right Deltoid Nml Nml Nml Nml Nml Nml Nml Nml Nml Nml Nml Nml N/A  Right FlexCarpiUln Nml Nml Nml Nml Nml Nml Nml Nml Nml Nml Nml Nml N/A  Left 1stDorInt Nml Nml Nml Nml Nml Nml Nml Nml Nml Nml Nml Nml N/A  Left PronatorTeres Nml Nml Nml Nml Nml Nml Nml Nml Nml Nml Nml Nml N/A  Left Biceps Nml Nml Nml Nml Nml Nml Nml Nml Nml Nml Nml Nml N/A  Left Deltoid Nml Nml Nml Nml Nml Nml Nml Nml  Nml Nml Nml Nml N/A  Left FlexCarpiUln Nml Nml Nml Nml Nml Nml Nml Nml Nml Nml Nml Nml N/A  Left Triceps Nml Nml Nml Nml Nml Nml Nml Nml Nml Nml Nml Nml N/A      Waveforms:

## 2018-01-29 ENCOUNTER — Other Ambulatory Visit: Payer: Self-pay | Admitting: Family Medicine

## 2018-01-29 DIAGNOSIS — F32A Depression, unspecified: Secondary | ICD-10-CM

## 2018-01-29 DIAGNOSIS — F329 Major depressive disorder, single episode, unspecified: Secondary | ICD-10-CM

## 2018-01-31 NOTE — Telephone Encounter (Signed)
Attempted to contact pt regarding request for citalopram; he states he did not request a refill on this medication and he does not want it; MD not dated 01/17/18 states that pt does not take this medication; will notify pharmacy.

## 2018-02-17 ENCOUNTER — Ambulatory Visit: Payer: BLUE CROSS/BLUE SHIELD | Admitting: Family Medicine

## 2018-02-17 ENCOUNTER — Encounter: Payer: Self-pay | Admitting: Family Medicine

## 2018-02-17 ENCOUNTER — Other Ambulatory Visit: Payer: Self-pay

## 2018-02-17 VITALS — BP 132/78 | HR 83 | Temp 98.0°F | Ht 66.0 in | Wt 202.8 lb

## 2018-02-17 DIAGNOSIS — G8929 Other chronic pain: Secondary | ICD-10-CM

## 2018-02-17 DIAGNOSIS — R42 Dizziness and giddiness: Secondary | ICD-10-CM | POA: Diagnosis not present

## 2018-02-17 DIAGNOSIS — M549 Dorsalgia, unspecified: Secondary | ICD-10-CM

## 2018-02-17 DIAGNOSIS — A630 Anogenital (venereal) warts: Secondary | ICD-10-CM | POA: Diagnosis not present

## 2018-02-17 DIAGNOSIS — Z113 Encounter for screening for infections with a predominantly sexual mode of transmission: Secondary | ICD-10-CM

## 2018-02-17 DIAGNOSIS — M545 Low back pain: Secondary | ICD-10-CM | POA: Diagnosis not present

## 2018-02-17 NOTE — Patient Instructions (Addendum)
Dizziness symptoms may be due to vertigo, which also may be due to congestion behind the ears and allergies.  Try Flonase nasal spray, make sure you drink plenty of fluids, especially when it is hot outside.  Take breaks and cool off as needed. If dizziness does not continue to improve in the next week, or any acute worsening symptoms, be seen here or the emergency room as we discussed.  See information below on back pain.  I refilled meloxicam one more time to use only as needed.  Try to reserve that for days when Tylenol is not helping, as there are long-term risks with that group of medications as we discussed.  I still would recommend being evaluated by physical therapy as soon as possible, or can refer you to back specialist if you prefer.  If any acute worsening of symptoms, return for recheck here in the emergency room  I have referred you to dermatology to look into other treatments for genital warts.  Continue to treat current irritated skin with soap and water cleanses, topical antibiotic Polysporin if needed, and return if any worsening rash, redness, or discharge.  Avoid [place] for now. I will check STI testing today.    Back Exercises The following exercises strengthen the muscles that help to support the back. They also help to keep the lower back flexible. Doing these exercises can help to prevent back pain or lessen existing pain. If you have back pain or discomfort, try doing these exercises 2-3 times each day or as told by your health care provider. When the pain goes away, do them once each day, but increase the number of times that you repeat the steps for each exercise (do more repetitions). If you do not have back pain or discomfort, do these exercises once each day or as told by your health care provider. Exercises Single Knee to Chest  Repeat these steps 3-5 times for each leg: 1. Lie on your back on a firm bed or the floor with your legs extended. 2. Bring one knee to your  chest. Your other leg should stay extended and in contact with the floor. 3. Hold your knee in place by grabbing your knee or thigh. 4. Pull on your knee until you feel a gentle stretch in your lower back. 5. Hold the stretch for 10-30 seconds. 6. Slowly release and straighten your leg.  Pelvic Tilt  Repeat these steps 5-10 times: 1. Lie on your back on a firm bed or the floor with your legs extended. 2. Bend your knees so they are pointing toward the ceiling and your feet are flat on the floor. 3. Tighten your lower abdominal muscles to press your lower back against the floor. This motion will tilt your pelvis so your tailbone points up toward the ceiling instead of pointing to your feet or the floor. 4. With gentle tension and even breathing, hold this position for 5-10 seconds.  Cat-Cow  Repeat these steps until your lower back becomes more flexible: 1. Get into a hands-and-knees position on a firm surface. Keep your hands under your shoulders, and keep your knees under your hips. You may place padding under your knees for comfort. 2. Let your head hang down, and point your tailbone toward the floor so your lower back becomes rounded like the back of a cat. 3. Hold this position for 5 seconds. 4. Slowly lift your head and point your tailbone up toward the ceiling so your back forms a sagging arch  like the back of a cow. 5. Hold this position for 5 seconds.  Press-Ups  Repeat these steps 5-10 times: 1. Lie on your abdomen (face-down) on the floor. 2. Place your palms near your head, about shoulder-width apart. 3. While you keep your back as relaxed as possible and keep your hips on the floor, slowly straighten your arms to raise the top half of your body and lift your shoulders. Do not use your back muscles to raise your upper torso. You may adjust the placement of your hands to make yourself more comfortable. 4. Hold this position for 5 seconds while you keep your back  relaxed. 5. Slowly return to lying flat on the floor.  Bridges  Repeat these steps 10 times: 1. Lie on your back on a firm surface. 2. Bend your knees so they are pointing toward the ceiling and your feet are flat on the floor. 3. Tighten your buttocks muscles and lift your buttocks off of the floor until your waist is at almost the same height as your knees. You should feel the muscles working in your buttocks and the back of your thighs. If you do not feel these muscles, slide your feet 1-2 inches farther away from your buttocks. 4. Hold this position for 3-5 seconds. 5. Slowly lower your hips to the starting position, and allow your buttocks muscles to relax completely.  If this exercise is too easy, try doing it with your arms crossed over your chest. Abdominal Crunches  Repeat these steps 5-10 times: 1. Lie on your back on a firm bed or the floor with your legs extended. 2. Bend your knees so they are pointing toward the ceiling and your feet are flat on the floor. 3. Cross your arms over your chest. 4. Tip your chin slightly toward your chest without bending your neck. 5. Tighten your abdominal muscles and slowly raise your trunk (torso) high enough to lift your shoulder blades a tiny bit off of the floor. Avoid raising your torso higher than that, because it can put too much stress on your low back and it does not help to strengthen your abdominal muscles. 6. Slowly return to your starting position.  Back Lifts Repeat these steps 5-10 times: 1. Lie on your abdomen (face-down) with your arms at your sides, and rest your forehead on the floor. 2. Tighten the muscles in your legs and your buttocks. 3. Slowly lift your chest off of the floor while you keep your hips pressed to the floor. Keep the back of your head in line with the curve in your back. Your eyes should be looking at the floor. 4. Hold this position for 3-5 seconds. 5. Slowly return to your starting position.  Contact  a health care provider if:  Your back pain or discomfort gets much worse when you do an exercise.  Your back pain or discomfort does not lessen within 2 hours after you exercise. If you have any of these problems, stop doing these exercises right away. Do not do them again unless your health care provider says that you can. Get help right away if:  You develop sudden, severe back pain. If this happens, stop doing the exercises right away. Do not do them again unless your health care provider says that you can. This information is not intended to replace advice given to you by your health care provider. Make sure you discuss any questions you have with your health care provider. Document Released: 07/30/2004 Document Revised:  10/30/2015 Document Reviewed: 08/16/2014 Elsevier Interactive Patient Education  2017 [employer]  Back Pain, Adult Many adults have back pain from time to time. Common causes of back pain include:  A strained muscle or ligament.  Wear and tear (degeneration) of the spinal disks.  Arthritis.  A hit to the back.  Back pain can be short-lived (acute) or last a long time (chronic). A physical exam, lab tests, and imaging studies may be done to find the cause of your pain. Follow these instructions at home: Managing pain and stiffness  Take over-the-counter and prescription medicines only as told by your health care provider.  If directed, apply heat to the affected area as often as told by your health care provider. Use the heat source that your health care provider recommends, such as a moist heat pack or a heating pad. ? Place a towel between your skin and the heat source. ? Leave the heat on for 20-30 minutes. ? Remove the heat if your skin turns bright red. This is especially important if you are unable to feel pain, heat, or cold. You have a greater risk of getting burned.  If directed, apply ice to the injured area: ? Put ice in a plastic bag. ? Place a  towel between your skin and the bag. ? Leave the ice on for 20 minutes, 2-3 times a day for the first 2-3 days. Activity  Do not stay in bed. Resting more than 1-2 days can delay your recovery.  Take short walks on even surfaces as soon as you are able. Try to increase the length of time you walk each day.  Do not sit, drive, or stand in one place for more than 30 minutes at a time. Sitting or standing for long periods of time can put stress on your back.  Use proper lifting techniques. When you bend and lift, use positions that put less stress on your back: ? [place] your [place]. ? Keep the load close to your body. ? Avoid twisting.  Exercise regularly as told by your health care provider. Exercising will help your back heal faster. This also helps prevent back injuries by keeping muscles strong and flexible.  Your health care provider may recommend that you see a physical therapist. This person can help you come up with a safe exercise program. Do any exercises as told by your physical therapist. Lifestyle  Maintain a healthy weight. Extra weight puts stress on your back and makes it difficult to have good posture.  Avoid activities or situations that make you feel anxious or stressed. Learn ways to manage anxiety and stress. One way to manage stress is through exercise. Stress and anxiety increase muscle tension and can make back pain worse. General instructions  Sleep on a firm mattress in a comfortable position. Try lying on your side with your knees slightly bent. If you lie on your back, put a pillow under your knees.  Follow your treatment plan as told by your health care provider. This may include: ? Cognitive or behavioral therapy. ? Acupuncture or massage therapy. ? Meditation or yoga. Contact a health care provider if:  You have pain that is not relieved with rest or medicine.  You have increasing pain going down into your legs or buttocks.  Your pain does not improve in  2 weeks.  You have pain at night.  You lose weight.  You have a fever or chills. Get help right away if:  You develop new bowel  or bladder control problems.  You have unusual weakness or numbness in your arms or legs.  You develop nausea or vomiting.  You develop abdominal pain.  You feel faint. Summary  Many adults have back pain from time to time. A physical exam, lab tests, and imaging studies may be done to find the cause of your pain.  Use proper lifting techniques. When you bend and lift, use positions that put less stress on your back.  Take over-the-counter and prescription medicines and apply heat or ice as directed by your health care provider. This information is not intended to replace advice given to you by your health care provider. Make sure you discuss any questions you have with your health care provider. Document Released: 06/22/2005 Document Revised: 07/27/2016 Document Reviewed: 07/27/2016 Elsevier Interactive Patient Education  2018 [employer].   Vertigo Vertigo is the feeling that you or your surroundings are moving when they are not. Vertigo can be dangerous if it occurs while you are doing something that could endanger you or others, such as driving. What are the causes? This condition is caused by a disturbance in the signals that are sent by your body's sensory systems to your brain. Different causes of a disturbance can lead to vertigo, including:  Infections, especially in the inner ear.  A bad reaction to a drug, or misuse of alcohol and medicines.  Withdrawal from drugs or alcohol.  Quickly changing positions, as when lying down or rolling over in bed.  Migraine headaches.  Decreased blood flow to the brain.  Decreased blood pressure.  Increased pressure in the brain from a head or neck injury, stroke, infection, tumor, or bleeding.  Central nervous system disorders.  What are the signs or symptoms? Symptoms of this condition  usually occur when you move your head or your eyes in different directions. Symptoms may start suddenly, and they usually last for less than a minute. Symptoms may include:  Loss of balance and falling.  Feeling like you are spinning or moving.  Feeling like your surroundings are spinning or moving.  Nausea and vomiting.  Blurred vision or double vision.  Difficulty hearing.  Slurred speech.  Dizziness.  Involuntary eye movement (nystagmus).  Symptoms can be mild and cause only slight annoyance, or they can be severe and interfere with daily life. Episodes of vertigo may return (recur) over time, and they are often triggered by certain movements. Symptoms may improve over time. How is this diagnosed? This condition may be diagnosed based on medical history and the quality of your nystagmus. Your health care provider may test your eye movements by asking you to quickly change positions to trigger the nystagmus. This may be called the Dix-Hallpike test, head thrust test, or roll test. You may be referred to a health care provider who specializes in ear, nose, and throat (ENT) problems (otolaryngologist) or a provider who specializes in disorders of the central nervous system (neurologist). You may have additional testing, including:  A physical exam.  Blood tests.  MRI.  A CT scan.  An electrocardiogram (ECG). This records electrical activity in your heart.  An electroencephalogram (EEG). This records electrical activity in your brain.  Hearing tests.  How is this treated? Treatment for this condition depends on the cause and the severity of the symptoms. Treatment options include:  Medicines to treat nausea or vertigo. These are usually used for severe cases. Some medicines that are used to treat other conditions may also reduce or eliminate vertigo symptoms.  These include: ? Medicines that control allergies (antihistamines). ? Medicines that control seizures  (anticonvulsants). ? Medicines that relieve depression (antidepressants). ? Medicines that relieve anxiety (sedatives).  Head movements to adjust your inner ear back to normal. If your vertigo is caused by an ear problem, your health care provider may recommend certain movements to correct the problem.  Surgery. This is rare.  Follow these instructions at home: Safety  Move slowly.Avoid sudden body or head movements.  Avoid driving.  Avoid operating heavy machinery.  Avoid doing any tasks that would cause danger to you or others if you would have a vertigo episode during the task.  If you have trouble walking or keeping your balance, try using a cane for stability. If you feel dizzy or unstable, sit down right away.  Return to your normal activities as told by your health care provider. Ask your health care provider what activities are safe for you. General instructions  Take over-the-counter and prescription medicines only as told by your health care provider.  Avoid certain positions or movements as told by your health care provider.  Drink enough fluid to keep your urine clear or pale yellow.  Keep all follow-up visits as told by your health care provider. This is important. Contact a health care provider if:  Your medicines do not relieve your vertigo or they make it worse.  You have a fever.  Your condition gets worse or you develop new symptoms.  Your family or friends notice any behavioral changes.  Your nausea or vomiting gets worse.  You have numbness or a "pins and needles" sensation in part of your body. Get help right away if:  You have difficulty moving or speaking.  You are always dizzy.  You faint.  You develop severe headaches.  You have weakness in your hands, arms, or legs.  You have changes in your hearing or vision.  You develop a stiff neck.  You develop sensitivity to light. This information is not intended to replace advice given to  you by your health care provider. Make sure you discuss any questions you have with your health care provider. Document Released: 04/01/2005 Document Revised: 12/04/2015 Document Reviewed: 10/15/2014 Elsevier Interactive Patient Education  2018 [employer].    IF you received an x-ray today, you will receive an invoice from [facility]. Please contact [facility] at [phone] with questions or concerns regarding your invoice.   IF you received labwork today, you will receive an invoice from [place]. Please contact [facility] at [phone] with questions or concerns regarding your invoice.   Our billing staff will not be able to assist you with questions regarding bills from these companies.  You will be contacted with the lab results as soon as they are available. The fastest way to get your results is to activate your My Chart account. Instructions are located on the last page of this paperwork. If you have not heard from [place] regarding the results in 2 weeks, please contact this office.

## 2018-02-17 NOTE — Progress Notes (Signed)
Subjective:  By signing my name below, I, Brad Gray, attest that this documentation has been prepared under the direction and in the presence of [name], MD Electronically Signed: Charline Bills, ED Scribe 02/17/2018 at 1:42 PM.   Patient ID: Brad Gray, male    DOB: Dec 27, 1970, 47 y.o.   MRN: [id number]  Chief Complaint  Patient presents with  . Back Pain    1 month f/u / did not do PT due to cost  . med side effects    aldara cream has burned skin   . Dizziness    episode of dizziness one weekend and now its better.    HPI Brad Gray is a 47 y.o. male who presents to Primary Care at [facility] here with concerns above.  Back Pain Seen 7/15. H/o chronic low back pain. PT referral and Mobic. Had not seen PT last visit, planned to reschedule. Initial improvement on Mobic then plateau. On exam, he had low paraspinal and mid/upper paraspinal tenderness. Planned for f/u with PT and agreed to refill Mobic at that time. Imaging in 2016 of lumbar and thoracic spine, mild scoliosis, mild degenerative changes. - Pt reports increased back pain with bending. Takes Mobic most days but misses 1-2 days. Denies bladder/bowel incontinence, new weakness in LEs, unexplained weight loss, night sweats, fever, hematuria, urinary symptoms, cp, sob.  Genital Warts See prior visits. Prescribed aldara to apply to area 3 times/wk. - Pt reports some irritation/skin burning with the aldara cream x 1 wk, worse when he only had 3-4 packs remaining. States he was rubbing the cream in but was going over the areas 2-3 times. Pt last used the cream 1 wk ago. He has applied antibiotic cream, skin therapy and cocoa butter with some improvement. Pt also requests STI screening at this time.  Episode of Dizziness Pt noticed an episode of dizziness 2 wks ago. States he was digging in his ear the night prior and noticed a sharp ear pain and extreme dizziness the following morning. Also reports that he  constantly feels congested for which he treated with 4 sprays of Flonase. States he still has occasional wooziness with standing too quickly but overall he feels better. Denies leg weakness, slurred speech, cp, palpitations, has a slight buzzing in ear but not pulsatile, hearing changes, focal weakness.   Patient Active Problem List   Diagnosis Date Noted  . Condyloma acuminata-perirectal 10/07/2011  . BACK PAIN 10/02/2008  . CONDYLOMA ACUMINATA, PENIS 06/14/2007  . ARTHRITIS 06/14/2007   Past Medical History:  Diagnosis Date  . Joint pain    Past Surgical History:  Procedure Laterality Date  . KNEE ARTHROSCOPY    . SHOULDER ARTHROSCOPY     No Known Allergies Prior to Admission medications   Medication Sig Start Date End Date Taking? Authorizing Provider  ibuprofen (ADVIL,MOTRIN) 200 MG tablet Take 200 mg by mouth every 6 (six) hours as needed.   Yes [provider]  meloxicam (MOBIC) 7.5 MG tablet Take 1 tablet (7.5 mg total) by mouth daily as needed for pain. 01/17/18  Yes [name], MD   Social History   Socioeconomic History  . Marital status: Single    Spouse name: Not on file  . Number of children: Not on file  . Years of education: Not on file  . Highest education level: Not on file  Occupational History  . Not on file  Social Needs  . Financial resource strain: Not on file  .  Food insecurity:    Worry: Not on file    Inability: Not on file  . Transportation needs:    Medical: Not on file    Non-medical: Not on file  Tobacco Use  . Smoking status: Current Every Day Smoker    Packs/day: 0.50  . Smokeless tobacco: Never Used  Substance and Sexual Activity  . Alcohol use: Yes    Comment: socially  . Drug use: No  . Sexual activity: Yes    Birth control/protection: None  Lifestyle  . Physical activity:    Days per week: Not on file    Minutes per session: Not on file  . Stress: Not on file  Relationships  . Social connections:    Talks on  phone: Not on file    Gets together: Not on file    Attends religious service: Not on file    Active member of club or organization: Not on file    Attends meetings of clubs or organizations: Not on file    Relationship status: Not on file  . Intimate partner violence:    Fear of current or ex partner: Not on file    Emotionally abused: Not on file    Physically abused: Not on file    Forced sexual activity: Not on file  Other Topics Concern  . Not on file  Social History Narrative  . Not on file   Review of Systems  Constitutional: Negative for diaphoresis, fever and unexpected weight change.  HENT: Positive for congestion and ear pain (resolved).   Respiratory: Negative for shortness of breath.   Cardiovascular: Negative for chest pain and palpitations.  Genitourinary: Positive for genital sores. Negative for hematuria.  Musculoskeletal: Positive for back pain.  Neurological: Positive for dizziness (resolved). Negative for speech difficulty and weakness.      Objective:   Physical Exam  Constitutional: He is oriented to person, place, and time. He appears well-developed and well-nourished. No distress.  HENT:  Head: Normocephalic and atraumatic.  Eyes: Conjunctivae and EOM are normal.  1-2 beats of horizontal nystagmus to the R with slight vertigo.  Neck: Neck supple. Carotid bruit is not present. No tracheal deviation present.  Cardiovascular: Normal rate.  Pulmonary/Chest: Effort normal. No respiratory distress.  Genitourinary:  Genitourinary Comments: Multiple condylomas including ventral R and L penile shaft and 2 above the anus. Patch of hypopigmented skin with erythema. No discharge below areas of condylomas on penis to scrotum. No surrounding erythema.  Musculoskeletal: Normal range of motion.  No midline bony tenderness. Describes area of pain in the paraspinal muscles diffusely in the back. Equal ROM. Flexion to 90 degrees.  Neurological: He is alert and oriented to  person, place, and time. He displays a negative Romberg sign.  Able to heel and toe walk without difficulty. No pronator drift. Normal finger to nose.  Skin: Skin is warm and dry.  Psychiatric: He has a normal mood and affect. His behavior is normal.  Nursing note and vitals reviewed.  Vitals:   02/17/18 1310  BP: 132/78  Pulse: 83  Temp: 98 F (36.7 C)  TempSrc: Oral  SpO2: 97%  Weight: 202 lb 12.8 oz (92 kg)  Height: 5\' 6"  (1.676 m)      Assessment & Plan:   TREVONE PRESTWOOD is a 47 y.o. male Chronic bilateral low back pain without sciatica Upper back pain   - recommended to start PT, but handout on exercises initially. Option of ortho referral also given   -  Refilled mobic once - side effects/risks discussed.   Genital warts - Plan: Ambulatory referral to Dermatology  - significant genital wart burden with initial attempt at Aldara, but intolearnt due to side effects. Will refer to dermatology to discuss options for removal/destruction.   - symptomatic care for abraded/irritated skin discussed with polyspoirin if needed until healed. rtc precautions   Vertigo  - now improved. May have been overheated, ETD/middle ear component.   -flonase ns trial, push fluids, rtc precautions.   Routine screening for STI (sexually transmitted infection) - Plan: GC/Chlamydia Probe Amp, HIV antibody, RPR  - testing above, safer sex practices discussed.   No orders of the defined types were placed in this encounter.  Patient Instructions   Dizziness symptoms may be due to vertigo, which also may be due to congestion behind the ears and allergies.  Try Flonase nasal spray, make sure you drink plenty of fluids, especially when it is hot outside.  Take breaks and cool off as needed. If dizziness does not continue to improve in the next week, or any acute worsening symptoms, be seen here or the emergency room as we discussed.  See information below on back pain.  I refilled meloxicam one more  time to use only as needed.  Try to reserve that for days when Tylenol is not helping, as there are long-term risks with that group of medications as we discussed.  I still would recommend being evaluated by physical therapy as soon as possible, or can refer you to back specialist if you prefer.  If any acute worsening of symptoms, return for recheck here in the emergency room  I have referred you to dermatology to look into other treatments for genital warts.  Continue to treat current irritated skin with soap and water cleanses, topical antibiotic Polysporin if needed, and return if any worsening rash, redness, or discharge.  Avoid Aldara for now. I will check STI testing today.    Back Exercises The following exercises strengthen the muscles that help to support the back. They also help to keep the lower back flexible. Doing these exercises can help to prevent back pain or lessen existing pain. If you have back pain or discomfort, try doing these exercises 2-3 times each day or as told by your health care provider. When the pain goes away, do them once each day, but increase the number of times that you repeat the steps for each exercise (do more repetitions). If you do not have back pain or discomfort, do these exercises once each day or as told by your health care provider. Exercises Single Knee to Chest  Repeat these steps 3-5 times for each leg: 1. Lie on your back on a firm bed or the floor with your legs extended. 2. Bring one knee to your chest. Your other leg should stay extended and in contact with the floor. 3. Hold your knee in place by grabbing your knee or thigh. 4. Pull on your knee until you feel a gentle stretch in your lower back. 5. Hold the stretch for 10-30 seconds. 6. Slowly release and straighten your leg.  Pelvic Tilt  Repeat these steps 5-10 times: 1. Lie on your back on a firm bed or the floor with your legs extended. 2. Bend your knees so they are pointing toward  the ceiling and your feet are flat on the floor. 3. Tighten your lower abdominal muscles to press your lower back against the floor. This motion will tilt your pelvis  so your tailbone points up toward the ceiling instead of pointing to your feet or the floor. 4. With gentle tension and even breathing, hold this position for 5-10 seconds.  Cat-Cow  Repeat these steps until your lower back becomes more flexible: 1. Get into a hands-and-knees position on a firm surface. Keep your hands under your shoulders, and keep your knees under your hips. You may place padding under your knees for comfort. 2. Let your head hang down, and point your tailbone toward the floor so your lower back becomes rounded like the back of a cat. 3. Hold this position for 5 seconds. 4. Slowly lift your head and point your tailbone up toward the ceiling so your back forms a sagging arch like the back of a cow. 5. Hold this position for 5 seconds.  Press-Ups  Repeat these steps 5-10 times: 1. Lie on your abdomen (face-down) on the floor. 2. Place your palms near your head, about shoulder-width apart. 3. While you keep your back as relaxed as possible and keep your hips on the floor, slowly straighten your arms to raise the top half of your body and lift your shoulders. Do not use your back muscles to raise your upper torso. You may adjust the placement of your hands to make yourself more comfortable. 4. Hold this position for 5 seconds while you keep your back relaxed. 5. Slowly return to lying flat on the floor.  Bridges  Repeat these steps 10 times: 1. Lie on your back on a firm surface. 2. Bend your knees so they are pointing toward the ceiling and your feet are flat on the floor. 3. Tighten your buttocks muscles and lift your buttocks off of the floor until your waist is at almost the same height as your knees. You should feel the muscles working in your buttocks and the back of your thighs. If you do not feel these  muscles, slide your feet 1-2 inches farther away from your buttocks. 4. Hold this position for 3-5 seconds. 5. Slowly lower your hips to the starting position, and allow your buttocks muscles to relax completely.  If this exercise is too easy, try doing it with your arms crossed over your chest. Abdominal Crunches  Repeat these steps 5-10 times: 1. Lie on your back on a firm bed or the floor with your legs extended. 2. Bend your knees so they are pointing toward the ceiling and your feet are flat on the floor. 3. Cross your arms over your chest. 4. Tip your chin slightly toward your chest without bending your neck. 5. Tighten your abdominal muscles and slowly raise your trunk (torso) high enough to lift your shoulder blades a tiny bit off of the floor. Avoid raising your torso higher than that, because it can put too much stress on your low back and it does not help to strengthen your abdominal muscles. 6. Slowly return to your starting position.  Back Lifts Repeat these steps 5-10 times: 1. Lie on your abdomen (face-down) with your arms at your sides, and rest your forehead on the floor. 2. Tighten the muscles in your legs and your buttocks. 3. Slowly lift your chest off of the floor while you keep your hips pressed to the floor. Keep the back of your head in line with the curve in your back. Your eyes should be looking at the floor. 4. Hold this position for 3-5 seconds. 5. Slowly return to your starting position.  Contact a health care provider  if:  Your back pain or discomfort gets much worse when you do an exercise.  Your back pain or discomfort does not lessen within 2 hours after you exercise. If you have any of these problems, stop doing these exercises right away. Do not do them again unless your health care provider says that you can. Get help right away if:  You develop sudden, severe back pain. If this happens, stop doing the exercises right away. Do not do them again  unless your health care provider says that you can. This information is not intended to replace advice given to you by your health care provider. Make sure you discuss any questions you have with your health care provider. Document Released: 07/30/2004 Document Revised: 10/30/2015 Document Reviewed: 08/16/2014 Elsevier Interactive Patient Education  2017 [employer] Inc.  Back Pain, Adult Many adults have back pain from time to time. Common causes of back pain include:  A strained muscle or ligament.  Wear and tear (degeneration) of the spinal disks.  Arthritis.  A hit to the back.  Back pain can be short-lived (acute) or last a long time (chronic). A physical exam, lab tests, and imaging studies may be done to find the cause of your pain. Follow these instructions at home: Managing pain and stiffness  Take over-the-counter and prescription medicines only as told by your health care provider.  If directed, apply heat to the affected area as often as told by your health care provider. Use the heat source that your health care provider recommends, such as a moist heat pack or a heating pad. ? Place a towel between your skin and the heat source. ? Leave the heat on for 20-30 minutes. ? Remove the heat if your skin turns bright red. This is especially important if you are unable to feel pain, heat, or cold. You have a greater risk of getting burned.  If directed, apply ice to the injured area: ? Put ice in a plastic bag. ? Place a towel between your skin and the bag. ? Leave the ice on for 20 minutes, 2-3 times a day for the first 2-3 days. Activity  Do not stay in bed. Resting more than 1-2 days can delay your recovery.  Take short walks on even surfaces as soon as you are able. Try to increase the length of time you walk each day.  Do not sit, drive, or stand in one place for more than 30 minutes at a time. Sitting or standing for long periods of time can put stress on your  back.  Use proper lifting techniques. When you bend and lift, use positions that put less stress on your back: ? Dry RidgeBend your knees. ? Keep the load close to your body. ? Avoid twisting.  Exercise regularly as told by your health care provider. Exercising will help your back heal faster. This also helps prevent back injuries by keeping muscles strong and flexible.  Your health care provider may recommend that you see a physical therapist. This person can help you come up with a safe exercise program. Do any exercises as told by your physical therapist. Lifestyle  Maintain a healthy weight. Extra weight puts stress on your back and makes it difficult to have good posture.  Avoid activities or situations that make you feel anxious or stressed. Learn ways to manage anxiety and stress. One way to manage stress is through exercise. Stress and anxiety increase muscle tension and can make back pain worse. General instructions  Sleep on a firm mattress in a comfortable position. Try lying on your side with your knees slightly bent. If you lie on your back, put a pillow under your knees.  Follow your treatment plan as told by your health care provider. This may include: ? Cognitive or behavioral therapy. ? Acupuncture or massage therapy. ? Meditation or yoga. Contact a health care provider if:  You have pain that is not relieved with rest or medicine.  You have increasing pain going down into your legs or buttocks.  Your pain does not improve in 2 weeks.  You have pain at night.  You lose weight.  You have a fever or chills. Get help right away if:  You develop new bowel or bladder control problems.  You have unusual weakness or numbness in your arms or legs.  You develop nausea or vomiting.  You develop abdominal pain.  You feel faint. Summary  Many adults have back pain from time to time. A physical exam, lab tests, and imaging studies may be done to find the cause of your  pain.  Use proper lifting techniques. When you bend and lift, use positions that put less stress on your back.  Take over-the-counter and prescription medicines and apply heat or ice as directed by your health care provider. This information is not intended to replace advice given to you by your health care provider. Make sure you discuss any questions you have with your health care provider. Document Released: 06/22/2005 Document Revised: 07/27/2016 Document Reviewed: 07/27/2016 Elsevier Interactive Patient Education  2018 [employer]   Vertigo Vertigo is the feeling that you or your surroundings are moving when they are not. Vertigo can be dangerous if it occurs while you are doing something that could endanger you or others, such as driving. What are the causes? This condition is caused by a disturbance in the signals that are sent by your body's sensory systems to your brain. Different causes of a disturbance can lead to vertigo, including:  Infections, especially in the inner ear.  A bad reaction to a drug, or misuse of alcohol and medicines.  Withdrawal from drugs or alcohol.  Quickly changing positions, as when lying down or rolling over in bed.  Migraine headaches.  Decreased blood flow to the brain.  Decreased blood pressure.  Increased pressure in the brain from a head or neck injury, stroke, infection, tumor, or bleeding.  Central nervous system disorders.  What are the signs or symptoms? Symptoms of this condition usually occur when you move your head or your eyes in different directions. Symptoms may start suddenly, and they usually last for less than a minute. Symptoms may include:  Loss of balance and falling.  Feeling like you are spinning or moving.  Feeling like your surroundings are spinning or moving.  Nausea and vomiting.  Blurred vision or double vision.  Difficulty hearing.  Slurred speech.  Dizziness.  Involuntary eye movement  (nystagmus).  Symptoms can be mild and cause only slight annoyance, or they can be severe and interfere with daily life. Episodes of vertigo may return (recur) over time, and they are often triggered by certain movements. Symptoms may improve over time. How is this diagnosed? This condition may be diagnosed based on medical history and the quality of your nystagmus. Your health care provider may test your eye movements by asking you to quickly change positions to trigger the nystagmus. This may be called the Dix-Hallpike test, head thrust test, or roll test. You  may be referred to a health care provider who specializes in ear, nose, and throat (ENT) problems (otolaryngologist) or a provider who specializes in disorders of the central nervous system (neurologist). You may have additional testing, including:  A physical exam.  Blood tests.  MRI.  A CT scan.  An electrocardiogram (ECG). This records electrical activity in your heart.  An electroencephalogram (EEG). This records electrical activity in your brain.  Hearing tests.  How is this treated? Treatment for this condition depends on the cause and the severity of the symptoms. Treatment options include:  Medicines to treat nausea or vertigo. These are usually used for severe cases. Some medicines that are used to treat other conditions may also reduce or eliminate vertigo symptoms. These include: ? Medicines that control allergies (antihistamines). ? Medicines that control seizures (anticonvulsants). ? Medicines that relieve depression (antidepressants). ? Medicines that relieve anxiety (sedatives).  Head movements to adjust your inner ear back to normal. If your vertigo is caused by an ear problem, your health care provider may recommend certain movements to correct the problem.  Surgery. This is rare.  Follow these instructions at home: Safety  Move slowly.Avoid sudden body or head movements.  Avoid driving.  Avoid  operating heavy machinery.  Avoid doing any tasks that would cause danger to you or others if you would have a vertigo episode during the task.  If you have trouble walking or keeping your balance, try using a cane for stability. If you feel dizzy or unstable, sit down right away.  Return to your normal activities as told by your health care provider. Ask your health care provider what activities are safe for you. General instructions  Take over-the-counter and prescription medicines only as told by your health care provider.  Avoid certain positions or movements as told by your health care provider.  Drink enough fluid to keep your urine clear or pale yellow.  Keep all follow-up visits as told by your health care provider. This is important. Contact a health care provider if:  Your medicines do not relieve your vertigo or they make it worse.  You have a fever.  Your condition gets worse or you develop new symptoms.  Your family or friends notice any behavioral changes.  Your nausea or vomiting gets worse.  You have numbness or a "pins and needles" sensation in part of your body. Get help right away if:  You have difficulty moving or speaking.  You are always dizzy.  You faint.  You develop severe headaches.  You have weakness in your hands, arms, or legs.  You have changes in your hearing or vision.  You develop a stiff neck.  You develop sensitivity to light. This information is not intended to replace advice given to you by your health care provider. Make sure you discuss any questions you have with your health care provider. Document Released: 04/01/2005 Document Revised: 12/04/2015 Document Reviewed: 10/15/2014 Elsevier Interactive Patient Education  2018 [employer].    IF you received an x-ray today, you will receive an invoice from [facility]. Please contact [facility] at [phone] with questions or concerns regarding your invoice.    IF you received labwork today, you will receive an invoice from [facility]. Please contact [facility] at [phone] with questions or concerns regarding your invoice.   Our billing staff will not be able to assist you with questions regarding bills from these companies.  You will be contacted with the lab results as soon as they are  available. The fastest way to get your results is to activate your My Chart account. Instructions are located on the last page of this paperwork. If you have not heard from [place] regarding the results in 2 weeks, please contact this office.       I personally performed the services described in this documentation, which was scribed in my presence. The recorded information has been reviewed and considered for accuracy and completeness, addended by me as needed, and agree with information above.  Signed,   Meredith Staggers, MD Primary Care at [facility].  02/27/18 2:16 PM

## 2018-02-18 LAB — HIV ANTIBODY (ROUTINE TESTING W REFLEX): HIV Screen 4th Generation wRfx: NONREACTIVE

## 2018-02-18 LAB — RPR: RPR: NONREACTIVE

## 2018-02-19 LAB — GC/CHLAMYDIA PROBE AMP
Chlamydia trachomatis, NAA: NEGATIVE
NEISSERIA GONORRHOEAE BY PCR: NEGATIVE

## 2019-10-20 ENCOUNTER — Ambulatory Visit: Payer: BLUE CROSS/BLUE SHIELD | Admitting: Family Medicine

## 2019-10-27 ENCOUNTER — Other Ambulatory Visit: Payer: Self-pay

## 2019-10-27 ENCOUNTER — Ambulatory Visit (INDEPENDENT_AMBULATORY_CARE_PROVIDER_SITE_OTHER): Payer: 59

## 2019-10-27 ENCOUNTER — Encounter: Payer: Self-pay | Admitting: Family Medicine

## 2019-10-27 ENCOUNTER — Ambulatory Visit: Payer: 59

## 2019-10-27 ENCOUNTER — Ambulatory Visit: Payer: 59 | Admitting: Family Medicine

## 2019-10-27 VITALS — BP 126/73 | HR 65 | Temp 98.4°F | Ht 66.0 in | Wt 196.0 lb

## 2019-10-27 DIAGNOSIS — M898X1 Other specified disorders of bone, shoulder: Secondary | ICD-10-CM

## 2019-10-27 DIAGNOSIS — Z23 Encounter for immunization: Secondary | ICD-10-CM

## 2019-10-27 DIAGNOSIS — M542 Cervicalgia: Secondary | ICD-10-CM

## 2019-10-27 DIAGNOSIS — F17210 Nicotine dependence, cigarettes, uncomplicated: Secondary | ICD-10-CM

## 2019-10-27 DIAGNOSIS — R0789 Other chest pain: Secondary | ICD-10-CM | POA: Diagnosis not present

## 2019-10-27 DIAGNOSIS — R200 Anesthesia of skin: Secondary | ICD-10-CM

## 2019-10-27 DIAGNOSIS — Z131 Encounter for screening for diabetes mellitus: Secondary | ICD-10-CM

## 2019-10-27 DIAGNOSIS — R079 Chest pain, unspecified: Secondary | ICD-10-CM

## 2019-10-27 DIAGNOSIS — R0609 Other forms of dyspnea: Secondary | ICD-10-CM

## 2019-10-27 DIAGNOSIS — M546 Pain in thoracic spine: Secondary | ICD-10-CM

## 2019-10-27 DIAGNOSIS — R0600 Dyspnea, unspecified: Secondary | ICD-10-CM

## 2019-10-27 DIAGNOSIS — Z13220 Encounter for screening for lipoid disorders: Secondary | ICD-10-CM

## 2019-10-27 MED ORDER — MELOXICAM 7.5 MG PO TABS: 7.5000 mg | ORAL_TABLET | Freq: Every day | ORAL | 0 refills | Status: DC

## 2019-10-27 MED ORDER — CYCLOBENZAPRINE HCL 5 MG PO TABS: ORAL_TABLET | ORAL | 0 refills | Status: DC

## 2019-10-27 NOTE — Progress Notes (Signed)
Subjective:  Patient ID: Brad Gray, male    DOB: [birth year]  Age: 49 y.o. MRN: [id number]  CC:  Chief Complaint  Patient presents with  . Back Pain    pt states the pain started a few years ago. pain comes and gos. pain is located in pt's upperback. pt states he did get a sharp pain while dead lifting a few years ago. pt states this pain fatigues him. pain seems to shift from one side to the other. sometimes it's on the R side other days on his L. pt also gets headaches in the back of his head at times.    HPI Brad Gray presents for   Upper back pain Past few years.  No known initial injury.  Noticed pain prior to time of deadlift a year or two ago, but worse pain b/t shoulder blades and upper mid back.   Tx: weight loss - 20#, sleeping in bed past 8 months instead of couch. Stretches, foam roller.  Weightlifting - noncompetitive, 3 times per week.  Strenuous work - Counselling psychologist, Herbalist.  Throbbing pain that takes breath away when working/exerting self. Pain radiates to front of chest and heart palpitations with exertion - noticed past year. Short of breath with exertion.  No FH of aortic aneurysm or known CAD.  Smoker - 1/2-1ppd.  No arm pain radiation, R hand numbness at times.  Both feet go to sleep at times, notes if sitting down too long.  Congential hand abnormalities.  Saw rheumatology in past - no known hx of RA. Normal rheum factor, neg ANA in 2014. I treated for LBP in 2019 - PT recommended. Cost prohibitive - home exercises.  Has not seen ortho.     Thoracic, lumbar spineXR 08/06/14: IMPRESSION: Mild thoracolumbar scoliosis and mild degenerative changes but no acute bony findings.   History Patient Active Problem List   Diagnosis Date Noted  . Condyloma acuminata-perirectal 10/07/2011  . BACK PAIN 10/02/2008  . CONDYLOMA ACUMINATA, PENIS 06/14/2007  . ARTHRITIS 06/14/2007   Past Medical History:  Diagnosis Date  . Joint pain    Past  Surgical History:  Procedure Laterality Date  . KNEE ARTHROSCOPY    . SHOULDER ARTHROSCOPY     No Known Allergies Prior to Admission medications   Medication Sig Start Date End Date Taking? Authorizing Provider  ibuprofen (ADVIL,MOTRIN) 200 MG tablet Take 200 mg by mouth every 6 (six) hours as needed.   Yes [provider]  meloxicam (MOBIC) 7.5 MG tablet Take 1 tablet (7.5 mg total) by mouth daily as needed for pain. Patient not taking: Reported on 10/27/2019 01/17/18   Brad Flood, MD   Social History   Socioeconomic History  . Marital status: Single    Spouse name: Not on file  . Number of children: Not on file  . Years of education: Not on file  . Highest education level: Not on file  Occupational History  . Not on file  Tobacco Use  . Smoking status: Current Every Day Smoker    Packs/day: 0.50  . Smokeless tobacco: Never Used  Substance and Sexual Activity  . Alcohol use: Yes    Comment: socially  . Drug use: No  . Sexual activity: Yes    Birth control/protection: None  Other Topics Concern  . Not on file  Social History Narrative  . Not on file   Social Determinants of Health   Financial Resource Strain:   . Difficulty  of Paying Living Expenses:   Food Insecurity:   . Worried About Charity fundraiser in the Last Year:   . Arboriculturist in the Last Year:   Transportation Needs:   . Film/video editor (Medical):   Marland Kitchen Lack of Transportation (Non-Medical):   Physical Activity:   . Days of Exercise per Week:   . Minutes of Exercise per Session:   Stress:   . Feeling of Stress :   Social Connections:   . Frequency of Communication with Friends and Family:   . Frequency of Social Gatherings with Friends and Family:   . Attends Religious Services:   . Active Member of Clubs or Organizations:   . Attends Archivist Meetings:   Marland Kitchen Marital Status:   Intimate Partner Violence:   . Fear of Current or Ex-Partner:   . Emotionally Abused:    Marland Kitchen Physically Abused:   . Sexually Abused:     Review of Systems   Objective:   Vitals:   10/27/19 1405  BP: 126/73  Pulse: 65  Temp: 98.4 F (36.9 C)  TempSrc: Temporal  SpO2: 99%  Weight: 196 lb (88.9 kg)  Height: 5\' 6"  (1.676 m)     Physical Exam Vitals reviewed.  Constitutional:      Appearance: He is well-developed.  HENT:     Head: Normocephalic and atraumatic.  Eyes:     Pupils: Pupils are equal, round, and reactive to light.  Neck:     Vascular: No carotid bruit or JVD.  Cardiovascular:     Rate and Rhythm: Normal rate and regular rhythm.     Heart sounds: Normal heart sounds. No murmur.  Pulmonary:     Effort: Pulmonary effort is normal.     Breath sounds: Normal breath sounds. No rales.  Chest:     Chest wall: Tenderness (Minimal reproducible tenderness along the left sternum/chest wall with deep palpation.) present.  Musculoskeletal:     Comments: C-spine slight decreased rotation, lateral flexion primarily to the right with some discomfort in the paraspinal muscles on the right, not radiating to arm.  No midline bony tenderness.  Equal strength upper extremities bilaterally, difficulty obtaining triceps reflex bilaterally, 1+ biceps and brachioradialis bilaterally.  T-spine, some diffuse tenderness along with mid aspect of the thoracic spine.  Slight tenderness palpation of the medial border of the scapula.  No apparent winging.   Skin:    General: Skin is warm and dry.  Neurological:     Mental Status: He is alert and oriented to person, place, and time.    EKG: Sinus bradycardia, rate 55, no acute findings.  DG Chest 2 View  Result Date: 10/27/2019 CLINICAL DATA:  49 year old male with back pain. EXAM: CHEST - 2 VIEW COMPARISON:  Chest radiograph dated 12/31/2012. FINDINGS: The lungs are clear. There is no pleural effusion or pneumothorax. The cardiac silhouette is within normal limits. No acute osseous pathology. There is chronic widening of the  left AC joint. IMPRESSION: No active cardiopulmonary disease. Electronically Signed   By: Anner Crete M.D.   On: 10/27/2019 15:04   DG Cervical Spine Complete  Result Date: 10/27/2019 CLINICAL DATA:  Back pain, neck pain, no history of injury EXAM: CERVICAL SPINE - COMPLETE 4+ VIEW COMPARISON:  None. FINDINGS: Frontal, bilateral oblique, and lateral views of the cervical spine are obtained. Alignment is anatomic to the cervicothoracic junction. No acute fracture. Minimal anterior osteophyte formation at C3-4 and C4-5. Neural foramina are widely  patent. Soft tissues are normal. Airways patent. Lung apices are clear. IMPRESSION: 1. Minimal spondylosis at C3-4 and C4-5. Electronically Signed   By: Sharlet Salina M.D.   On: 10/27/2019 15:20   DG Thoracic Spine 2 View  Result Date: 10/27/2019 CLINICAL DATA:  Upper back and neck pain EXAM: THORACIC SPINE 2 VIEWS COMPARISON:  08/06/2014 FINDINGS: Frontal and lateral views of the thoracic spine demonstrate normal anatomic alignment. No acute displaced fractures. There is multilevel thoracic spondylosis from approximately T3 through T8, with disc space narrowing and anterior osteophyte formation. Paraspinal soft tissues are unremarkable. The lungs are clear. IMPRESSION: 1. Midthoracic spondylosis.  No acute bony abnormality. Electronically Signed   By: Sharlet Salina M.D.   On: 10/27/2019 15:21   DG Scapula Right  Result Date: 10/27/2019 CLINICAL DATA:  Right scapular medial border pain EXAM: RIGHT SCAPULA - 2+ VIEWS COMPARISON:  None. FINDINGS: Frontal and transscapular views of the right scapula are obtained. No acute displaced fracture. Mild hypertrophic changes of the acromioclavicular joint. Glenohumeral joint is well preserved and well aligned. Visualized portions of the lungs are clear. IMPRESSION: 1. Unremarkable right scapula. 2. Mild acromioclavicular joint hypertrophy. Electronically Signed   By: Sharlet Salina M.D.   On: 10/27/2019 15:22      Assessment & Plan:  SHAKA CARDIN is a 49 y.o. male . Thoracic back pain, unspecified back pain laterality, unspecified chronicity - Plan: DG Cervical Spine Complete, DG Thoracic Spine 2 View Right arm numbness Neck pain Pain of right scapula - Plan: DG Scapula Right  -Imaging overall reassuring as above. possible overuse component, minimal cervical spondylosis.  Arm symptoms possible cervical radiculopathy.  Initial trial of meloxicam 7.5 mg daily as needed, Flexeril 5 mg nightly as needed,potential side effects discussed.  Relative rest/change in work activity next 2 weeks with repeat exam at that time  Need for prophylactic vaccination with combined diphtheria-tetanus-pertussis (DTP) vaccine - Plan: Tdap vaccine greater than or equal to 7yo IM  Chest pain, unspecified type - Plan: DG Chest 2 View, Sedimentation Rate, EKG 12-Lead Chest wall pain DOE (dyspnea on exertion)  -Reports radiating pain from the chest to back.  Dyspnea on exertion.  No known family history of CAD.  Risk factor of tobacco abuse.  Other risk stratification obtained with labs.  No widening of mediastinum noted on chest x-ray.  Asymptomatic at present.  Less likely aneurysm.  Consider CT if persistent, but will also refer to cardiology for now.  911/ER precautions given. Check sed rate, but no acute findings on EKG.  -Some chest wall pain, meloxicam as above.    Cigarette nicotine dependence without complication  -Cessation recommended, handout given.  Screening for hyperlipidemia - Plan: Comprehensive metabolic panel, Lipid panel  Screening for diabetes mellitus (DM) - Plan: Comprehensive metabolic panel   No orders of the defined types were placed in this encounter.  Patient Instructions       If you have lab work done today you will be contacted with your lab results within the next 2 weeks.  If you have not heard from [place] then please contact us. The fastest way to get your results is to register  for My Chart.   IF you received an x-ray today, you will receive an invoice from [facility]. Please contact [facility] at [phone] with questions or concerns regarding your invoice.   IF you received labwork today, you will receive an invoice from [place]. Please contact [facility] at [phone] with questions or concerns  regarding your invoice.   Our billing staff will not be able to assist you with questions regarding bills from these companies.  You will be contacted with the lab results as soon as they are available. The fastest way to get your results is to activate your My Chart account. Instructions are located on the last page of this paperwork. If you have not heard from [place] regarding the results in 2 weeks, please contact this office.         Signed, Merri Ray, MD [facility]

## 2019-10-27 NOTE — Patient Instructions (Addendum)
Neck pain, upper back pain can try meloxicam once per day.  Do not combine with other anti-inflammatories like Advil or Aleve over-the-counter.  Muscle relaxant cyclobenzaprine at bedtime as needed if tight muscles or more discomfort in the neck/arm.  I am checking some blood work today including inflammation test for your chest symptoms.  I recommended to avoid exertional activities that have brought on the chest pain or shortness of breath in the past until you are cleared to do so by cardiology.  I have placed a referral to cardiology. Return to the clinic or go to the nearest emergency room if any of your symptoms worsen or new symptoms occur.  I do recommend quitting smoking, let me know if there is some assistance I can provide to help.  See information below.  Thank you for coming in today, and will follow up in 2 weeks.   Nonspecific Chest Pain, Adult Chest pain can be caused by many different conditions. It can be caused by a condition that is life-threatening and requires treatment right away. It can also be caused by something that is not life-threatening. If you have chest pain, it can be hard to know the difference, so it is important to get help right away to make sure that you do not have a serious condition. Some life-threatening causes of chest pain include:  Heart attack.  A tear in the body's main blood vessel (aortic dissection).  Inflammation around your heart (pericarditis).  A problem in the lungs, such as a blood clot (pulmonary embolism) or a collapsed lung (pneumothorax). Some non life-threatening causes of chest pain include:  Heartburn.  Anxiety or stress.  Damage to the bones, muscles, and cartilage that make up your chest wall.  Pneumonia or bronchitis.  Shingles infection (varicella-zoster virus). Chest pain can feel like:  Pain or discomfort on the surface of your chest or deep in your chest.  Crushing, pressure, aching, or squeezing  pain.  Burning or tingling.  Dull or sharp pain that is worse when you move, cough, or take a deep breath.  Pain or discomfort that is also felt in your back, neck, jaw, shoulder, or arm, or pain that spreads to any of these areas. Your chest pain may come and go. It may also be constant. Your health care provider will do lab tests and other studies to find the cause of your pain. Treatment will depend on the cause of your chest pain. Follow these instructions at home: Medicines  Take over-the-counter and prescription medicines only as told by your health care provider.  If you were prescribed an antibiotic, take it as told by your health care provider. Do not stop taking the antibiotic even if you start to feel better. Lifestyle   Rest as directed by your health care provider.  Do not use any products that contain nicotine or tobacco, such as cigarettes and e-cigarettes. If you need help quitting, ask your health care provider.  Do not drink alcohol.  Make healthy lifestyle choices as recommended. These may include: ? Getting regular exercise. Ask your health care provider to suggest some activities that are safe for you. ? Eating a heart-healthy diet. This includes plenty of fresh fruits and vegetables, whole grains, low-fat (lean) protein, and low-fat dairy products. A dietitian can help you find healthy eating options. ? Maintaining a healthy weight. ? Managing any other health conditions you have, such as high blood pressure (hypertension) or diabetes. ? Reducing stress, such as with yoga or  relaxation techniques. General instructions  Pay attention to any changes in your symptoms. Tell your health care provider about them or any new symptoms.  Avoid any activities that cause chest pain.  Keep all follow-up visits as told by your health care provider. This is important. This includes visits for any further testing if your chest pain does not go away. Contact a health care  provider if:  Your chest pain does not go away.  You feel depressed.  You have a fever. Get help right away if:  Your chest pain gets worse.  You have a cough that gets worse, or you cough up blood.  You have severe pain in your abdomen.  You faint.  You have sudden, unexplained chest discomfort.  You have sudden, unexplained discomfort in your arms, back, neck, or jaw.  You have shortness of breath at any time.  You suddenly start to sweat, or your skin gets clammy.  You feel nausea or you vomit.  You suddenly feel lightheaded or dizzy.  You have severe weakness, or unexplained weakness or fatigue.  Your heart begins to beat quickly, or it feels like it is skipping beats. These symptoms may represent a serious problem that is an emergency. Do not wait to see if the symptoms will go away. Get medical help right away. Call your local emergency services (911 in the [place] Do not drive yourself to the hospital. Summary  Chest pain can be caused by a condition that is serious and requires urgent treatment. It may also be caused by something that is not life-threatening.  If you have chest pain, it is very important to see your health care provider. Your health care provider may do lab tests and other studies to find the cause of your pain.  Follow your health care provider's instructions on taking medicines, making lifestyle changes, and getting emergency treatment if symptoms become worse.  Keep all follow-up visits as told by your health care provider. This includes visits for any further testing if your chest pain does not go away. This information is not intended to replace advice given to you by your health care provider. Make sure you discuss any questions you have with your health care provider. Document Revised: 12/23/2017 Document Reviewed: 12/23/2017 Elsevier Patient Education  2020 [employer]   Chest Wall Pain Chest wall pain is pain in or around the bones  and muscles of your chest. Sometimes, an injury causes this pain. Excessive coughing or overuse of arm and chest muscles may also cause chest wall pain. Sometimes, the cause may not be known. This pain may take several weeks or longer to get better. Follow these instructions at home: Managing pain, stiffness, and swelling   If directed, put ice on the painful area: ? Put ice in a plastic bag. ? Place a towel between your skin and the bag. ? Leave the ice on for 20 minutes, 2-3 times per day. Activity  Rest as told by your health care provider.  Avoid activities that cause pain. These include any activities that use your chest muscles or your abdominal and side muscles to lift heavy items. Ask your health care provider what activities are safe for you. General instructions   Take over-the-counter and prescription medicines only as told by your health care provider.  Do not use any products that contain nicotine or tobacco, such as cigarettes, e-cigarettes, and chewing tobacco. These can delay healing after injury. If you need help quitting, ask your health care  provider.  Keep all follow-up visits as told by your health care provider. This is important. Contact a health care provider if:  You have a fever.  Your chest pain becomes worse.  You have new symptoms. Get help right away if:  You have nausea or vomiting.  You feel sweaty or light-headed.  You have a cough with mucus from your lungs (sputum) or you cough up blood.  You develop shortness of breath. These symptoms may represent a serious problem that is an emergency. Do not wait to see if the symptoms will go away. Get medical help right away. Call your local emergency services (911 in the [place] Do not drive yourself to the hospital. Summary  Chest wall pain is pain in or around the bones and muscles of your chest.  Depending on the cause, it may be treated with ice, rest, medicines, and avoiding activities that  cause pain.  Contact a health care provider if you have a fever, worsening chest pain, or new symptoms.  Get help right away if you feel light-headed or you develop shortness of breath. These symptoms may be an emergency. This information is not intended to replace advice given to you by your health care provider. Make sure you discuss any questions you have with your health care provider. Document Revised: 12/23/2017 Document Reviewed: 12/23/2017 Elsevier Patient Education  2020 [employer].   Steps to Quit Smoking Smoking tobacco is the leading cause of preventable death. It can affect almost every organ in the body. Smoking puts you and those around you at risk for developing many serious chronic diseases. Quitting smoking can be difficult, but it is one of the best things that you can do for your health. It is never too late to quit. How do I get ready to quit? When you decide to quit smoking, create a plan to help you succeed. Before you quit:  Pick a date to quit. Set a date within the next 2 weeks to give you time to prepare.  Write down the reasons why you are quitting. Keep this list in places where you will see it often.  Tell your family, friends, and co-workers that you are quitting. Support from your loved ones can make quitting easier.  Talk with your health care provider about your options for quitting smoking.  Find out what treatment options are covered by your health insurance.  Identify people, places, things, and activities that make you want to smoke (triggers). Avoid them. What first steps can I take to quit smoking?  Throw away all cigarettes at home, at work, and in your car.  Throw away smoking accessories, such as Set designer.  Clean your car. Make sure to empty the ashtray.  Clean your home, including curtains and carpets. What strategies can I use to quit smoking? Talk with your health care provider about combining strategies, such as taking  medicines while you are also receiving in-person counseling. Using these two strategies together makes you more likely to succeed in quitting than if you used either strategy on its own.  If you are pregnant or breastfeeding, talk with your health care provider about finding counseling or other support strategies to quit smoking. Do not take medicine to help you quit smoking unless your health care provider tells you to do so. To quit smoking: Quit right away  Quit smoking completely, instead of gradually reducing how much you smoke over a period of time. Research shows that stopping smoking right away  is more successful than gradually quitting.  Attend in-person counseling to help you build problem-solving skills. You are more likely to succeed in quitting if you attend counseling sessions regularly. Even short sessions of 10 minutes can be effective. Take medicine You may take medicines to help you quit smoking. Some medicines require a prescription and some you can purchase over-the-counter. Medicines may have nicotine in them to replace the nicotine in cigarettes. Medicines may:  Help to stop cravings.  Help to relieve withdrawal symptoms. Your health care provider may recommend:  Nicotine patches, gum, or lozenges.  Nicotine inhalers or sprays.  Non-nicotine medicine that is taken by mouth. Find resources Find resources and support systems that can help you to quit smoking and remain smoke-free after you quit. These resources are most helpful when you use them often. They include:  Online chats with a Social worker.  Telephone quitlines.  Printed Furniture conservator/restorer.  Support groups or group counseling.  Text messaging programs.  Mobile phone apps or applications. Use apps that can help you stick to your quit plan by providing reminders, tips, and encouragement. There are many free apps for mobile devices as well as websites. Examples include Quit Guide from the [employer] and  smokefree.gov What things can I do to make it easier to quit?   Reach out to your family and friends for support and encouragement. Call telephone quitlines (1-800-QUIT-NOW), reach out to support groups, or work with a counselor for support.  Ask people who smoke to avoid smoking around you.  Avoid places that trigger you to smoke, such as bars, parties, or smoke-break areas at work.  Spend time with people who do not smoke.  Lessen the stress in your life. Stress can be a smoking trigger for some people. To lessen stress, try: ? Exercising regularly. ? Doing deep-breathing exercises. ? Doing yoga. ? Meditating. ? Performing a body scan. This involves closing your eyes, scanning your body from head to toe, and noticing which parts of your body are particularly tense. Try to relax the muscles in those areas. How will I feel when I quit smoking? Day 1 to 3 weeks Within the first 24 hours of quitting smoking, you may start to feel withdrawal symptoms. These symptoms are usually most noticeable 2-3 days after quitting, but they usually do not last for more than 2-3 weeks. You may experience these symptoms:  Mood swings.  Restlessness, anxiety, or irritability.  Trouble concentrating.  Dizziness.  Strong cravings for sugary foods and nicotine.  Mild weight gain.  Constipation.  Nausea.  Coughing or a sore throat.  Changes in how the medicines that you take for unrelated issues work in your body.  Depression.  Trouble sleeping (insomnia). Week 3 and afterward After the first 2-3 weeks of quitting, you may start to notice more positive results, such as:  Improved sense of smell and taste.  Decreased coughing and sore throat.  Slower heart rate.  Lower blood pressure.  Clearer skin.  The ability to breathe more easily.  Fewer sick days. Quitting smoking can be very challenging. Do not get discouraged if you are not successful the first time. Some people need to  make many attempts to quit before they achieve long-term success. Do your best to stick to your quit plan, and talk with your health care provider if you have any questions or concerns. Summary  Smoking tobacco is the leading cause of preventable death. Quitting smoking is one of the best things that you can do  for your health.  When you decide to quit smoking, create a plan to help you succeed.  Quit smoking right away, not slowly over a period of time.  When you start quitting, seek help from your health care provider, family, or friends. This information is not intended to replace advice given to you by your health care provider. Make sure you discuss any questions you have with your health care provider. Document Revised: 03/17/2019 Document Reviewed: 09/10/2018 Elsevier Patient Education  [employer].   If you have lab work done today you will be contacted with your lab results within the next 2 weeks.  If you have not heard from [place] then please contact us. The fastest way to get your results is to register for My Chart.   IF you received an x-ray today, you will receive an invoice from [facility]. Please contact [facility] at [phone] with questions or concerns regarding your invoice.   IF you received labwork today, you will receive an invoice from [place]. Please contact [facility] at [phone] with questions or concerns regarding your invoice.   Our billing staff will not be able to assist you with questions regarding bills from these companies.  You will be contacted with the lab results as soon as they are available. The fastest way to get your results is to activate your My Chart account. Instructions are located on the last page of this paperwork. If you have not heard from [place] regarding the results in 2 weeks, please contact this office.

## 2019-10-28 ENCOUNTER — Encounter: Payer: Self-pay | Admitting: Family Medicine

## 2019-10-28 LAB — COMPREHENSIVE METABOLIC PANEL
ALT: 18 IU/L (ref 0–44)
AST: 19 IU/L (ref 0–40)
Albumin/Globulin Ratio: 2.1 (ref 1.2–2.2)
Albumin: 4.2 g/dL (ref 4.0–5.0)
Alkaline Phosphatase: 79 IU/L (ref 39–117)
BUN/Creatinine Ratio: 8 — ABNORMAL LOW (ref 9–20)
BUN: 11 mg/dL (ref 6–24)
Bilirubin Total: <0.2 mg/dL (ref 0.0–1.2)
CO2: 24 mmol/L (ref 20–29)
Calcium: 9.7 mg/dL (ref 8.7–10.2)
Chloride: 105 mmol/L (ref 96–106)
Creatinine, Ser: 1.39 mg/dL — ABNORMAL HIGH (ref 0.76–1.27)
GFR calc Af Amer: 70 mL/min/{1.73_m2} (ref >59)
GFR calc non Af Amer: 60 mL/min/{1.73_m2} (ref >59)
Globulin, Total: 2 g/dL (ref 1.5–4.5)
Glucose: 91 mg/dL (ref 65–99)
Potassium: 4.7 mmol/L (ref 3.5–5.2)
Sodium: 140 mmol/L (ref 134–144)
Total Protein: 6.2 g/dL (ref 6.0–8.5)

## 2019-10-28 LAB — LIPID PANEL
Chol/HDL Ratio: 3.5 ratio (ref 0.0–5.0)
Cholesterol, Total: 161 mg/dL (ref 100–199)
HDL: 46 mg/dL (ref >39)
LDL Chol Calc (NIH): 98 mg/dL (ref 0–99)
Triglycerides: 93 mg/dL (ref 0–149)
VLDL Cholesterol Cal: 17 mg/dL (ref 5–40)

## 2019-10-28 LAB — SEDIMENTATION RATE: Sed Rate: 3 mm/hr (ref 0–15)

## 2019-11-07 ENCOUNTER — Encounter: Payer: Self-pay | Admitting: Radiology

## 2019-11-10 ENCOUNTER — Ambulatory Visit: Payer: 59 | Admitting: Family Medicine

## 2019-11-10 ENCOUNTER — Ambulatory Visit (INDEPENDENT_AMBULATORY_CARE_PROVIDER_SITE_OTHER): Payer: 59

## 2019-11-10 ENCOUNTER — Other Ambulatory Visit: Payer: Self-pay

## 2019-11-10 ENCOUNTER — Encounter: Payer: Self-pay | Admitting: Family Medicine

## 2019-11-10 VITALS — BP 111/71 | HR 71 | Temp 97.5°F | Ht 66.0 in | Wt 196.0 lb

## 2019-11-10 DIAGNOSIS — M546 Pain in thoracic spine: Secondary | ICD-10-CM

## 2019-11-10 DIAGNOSIS — G8929 Other chronic pain: Secondary | ICD-10-CM

## 2019-11-10 DIAGNOSIS — F17210 Nicotine dependence, cigarettes, uncomplicated: Secondary | ICD-10-CM

## 2019-11-10 DIAGNOSIS — R7989 Other specified abnormal findings of blood chemistry: Secondary | ICD-10-CM

## 2019-11-10 DIAGNOSIS — M545 Low back pain, unspecified: Secondary | ICD-10-CM

## 2019-11-10 DIAGNOSIS — R0789 Other chest pain: Secondary | ICD-10-CM | POA: Diagnosis not present

## 2019-11-10 DIAGNOSIS — A630 Anogenital (venereal) warts: Secondary | ICD-10-CM

## 2019-11-10 LAB — BASIC METABOLIC PANEL
BUN/Creatinine Ratio: 14 (ref 9–20)
BUN: 17 mg/dL (ref 6–24)
CO2: 25 mmol/L (ref 20–29)
Calcium: 9.9 mg/dL (ref 8.7–10.2)
Chloride: 104 mmol/L (ref 96–106)
Creatinine, Ser: 1.23 mg/dL (ref 0.76–1.27)
GFR calc Af Amer: 81 mL/min/{1.73_m2} (ref >59)
GFR calc non Af Amer: 70 mL/min/{1.73_m2} (ref >59)
Glucose: 74 mg/dL (ref 65–99)
Potassium: 5 mmol/L (ref 3.5–5.2)
Sodium: 140 mmol/L (ref 134–144)

## 2019-11-10 MED ORDER — IMIQUIMOD 5 % EX CREA: TOPICAL_CREAM | CUTANEOUS | 1 refills | Status: DC

## 2019-11-10 NOTE — Patient Instructions (Addendum)
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 I will also refer you to back specialist to decide on physical therapy or other imaging like MRI.  Ok to continue mobic for now, flexeril only if needed.  Try to avoid heavy lifting if possible, and use your legs.  Return to the clinic or go to the nearest emergency room if any of your symptoms worsen or new symptoms occur.  I will check kidney test again today. continue to drink plenty of fluids.   aldara cream to genital warts 3 times per week.  If that causes too much irritation to the skin, can take a break for a few days or week or 2 if needed.  Follow-up with me in 1 month and we can decide if a dermatology eval is needed.  Return to the clinic or go to the nearest emergency room if any of your symptoms worsen or new symptoms occur.      Chronic Back Pain When back pain lasts longer than 3 months, it is called chronic back pain.The cause of your back pain may not be known. Some common causes include:  Wear and tear (degenerative disease) of the bones, ligaments, or disks in your back.  Inflammation and stiffness in your back (arthritis). People who have chronic back pain often go through certain periods in which the pain is more intense (flare-ups). Many people can learn to manage the pain with home care. Follow these instructions at home: Pay attention to any changes in your symptoms. Take these actions to help with your pain: Activity   Avoid bending and other activities that make the problem worse.  Maintain a proper position when standing or sitting: ? When standing, keep your upper back and neck straight, with your shoulders pulled back. Avoid slouching. ? When sitting, keep your back straight and relax your shoulders. Do not round your shoulders or pull them backward.  Do not sit or stand in one place for long periods of time.  Take brief periods of rest throughout the  day. This will reduce your pain. Resting in a lying or standing position is usually better than sitting to rest.  When you are resting for longer periods, mix in some mild activity or stretching between periods of rest. This will help to prevent stiffness and pain.  Get regular exercise. Ask your health care provider what activities are safe for you.  Always use proper lifting technique, which includes: ? Bending your knees. ? Keeping the load close to your body. ? Avoiding twisting.  Sleep on a firm mattress in a comfortable position. Try lying on your side with your knees slightly bent. If you lie on your back, put a pillow under your knees. Managing pain  If directed, apply ice to the painful area. Your health care provider may recommend applying ice during the first 24-48 hours after a flare-up begins. ? Put ice in a plastic bag. ? Place a towel between your skin and the bag. ? Leave the ice on for 20 minutes, 2-3 times per day.  If directed, apply heat to the affected area as often as told by your health care provider. Use the heat source that your health care provider recommends, such as a moist heat pack or a heating pad. ? Place a towel between your skin and the heat source. ? Leave the heat on for 20-30 minutes. ? Remove the heat if your skin turns  bright red. This is especially important if you are unable to feel pain, heat, or cold. You may have a greater risk of getting burned.  Try soaking in a warm tub.  Take over-the-counter and prescription medicines only as told by your health care provider.  Keep all follow-up visits as told by your health care provider. This is important. Contact a health care provider if:  You have pain that is not relieved with rest or medicine. Get help right away if:  You have weakness or numbness in one or both of your legs or feet.  You have trouble controlling your bladder or your bowels.  You have nausea or vomiting.  You have pain  in your abdomen.  You have shortness of breath or you faint. This information is not intended to replace advice given to you by your health care provider. Make sure you discuss any questions you have with your health care provider. Document Revised: 10/13/2018 Document Reviewed: 12/30/2016 Elsevier Patient Education  2020 [employer].   Coping with Quitting Smoking  Quitting smoking is a physical and mental challenge. You will face cravings, withdrawal symptoms, and temptation. Before quitting, work with your health care provider to make a plan that can help you cope. Preparation can help you quit and keep you from giving in. How can I cope with cravings? Cravings usually last for 5-10 minutes. If you get through it, the craving will pass. Consider taking the following actions to help you cope with cravings:  Keep your mouth busy: ? Chew sugar-free gum. ? Suck on hard candies or a straw. ? Brush your teeth.  Keep your hands and body busy: ? Immediately change to a different activity when you feel a craving. ? Squeeze or play with a ball. ? Do an activity or a hobby, like making bead jewelry, practicing needlepoint, or working with wood. ? Mix up your normal routine. ? Take a short exercise break. Go for a quick walk or run up and down stairs. ? Spend time in public places where smoking is not allowed.  Focus on doing something kind or helpful for someone else.  Call a friend or family member to talk during a craving.  Join a support group.  Call a quit line, such as 1-800-QUIT-NOW.  Talk with your health care provider about medicines that might help you cope with cravings and make quitting easier for you. How can I deal with withdrawal symptoms? Your body may experience negative effects as it tries to get used to not having nicotine in the system. These effects are called withdrawal symptoms. They may include:  Feeling hungrier than normal.  Trouble  concentrating.  Irritability.  Trouble sleeping.  Feeling depressed.  Restlessness and agitation.  Craving a cigarette. To manage withdrawal symptoms:  Avoid places, people, and activities that trigger your cravings.  Remember why you want to quit.  Get plenty of sleep.  Avoid coffee and other caffeinated drinks. These may worsen some of your symptoms. How can I handle social situations? Social situations can be difficult when you are quitting smoking, especially in the first few weeks. To manage this, you can:  Avoid parties, bars, and other social situations where people might be smoking.  Avoid alcohol.  Leave right away if you have the urge to smoke.  Explain to your family and friends that you are quitting smoking. Ask for understanding and support.  Plan activities with friends or family where smoking is not an option. What are some  ways I can cope with stress? Wanting to smoke may cause stress, and stress can make you want to smoke. Find ways to manage your stress. Relaxation techniques can help. For example:  Breathe slowly and deeply, in through your nose and out through your mouth.  Listen to soothing, relaxing music.  Talk with a family member or friend about your stress.  Light a candle.  Soak in a bath or take a shower.  Think about a peaceful place. What are some ways I can prevent weight gain? Be aware that many people gain weight after they quit smoking. However, not everyone does. To keep from gaining weight, have a plan in place before you quit and stick to the plan after you quit. Your plan should include:  Having healthy snacks. When you have a craving, it may help to: ? Eat plain popcorn, crunchy carrots, celery, or other cut vegetables. ? Chew sugar-free gum.  Changing how you eat: ? Eat small portion sizes at meals. ? Eat 4-6 small meals throughout the day instead of 1-2 large meals a day. ? Be mindful when you eat. Do not watch  television or do other things that might distract you as you eat.  Exercising regularly: ? Make time to exercise each day. If you do not have time for a long workout, do short bouts of exercise for 5-10 minutes several times a day. ? Do some form of strengthening exercise, like weight lifting, and some form of aerobic exercise, like running or swimming.  Drinking plenty of water or other low-calorie or no-calorie drinks. Drink 6-8 glasses of water daily, or as much as instructed by your health care provider. Summary  Quitting smoking is a physical and mental challenge. You will face cravings, withdrawal symptoms, and temptation to smoke again. Preparation can help you as you go through these challenges.  You can cope with cravings by keeping your mouth busy (such as by chewing gum), keeping your body and hands busy, and making calls to family, friends, or a helpline for people who want to quit smoking.  You can cope with withdrawal symptoms by avoiding places where people smoke, avoiding drinks with caffeine, and getting plenty of rest.  Ask your health care provider about the different ways to prevent weight gain, avoid stress, and handle social situations. This information is not intended to replace advice given to you by your health care provider. Make sure you discuss any questions you have with your health care provider. Document Revised: 06/04/2017 Document Reviewed: 06/19/2016 Elsevier Patient Education  [employer].   If you have lab work done today you will be contacted with your lab results within the next 2 weeks.  If you have not heard from [place] then please contact us. The fastest way to get your results is to register for My Chart.   IF you received an x-ray today, you will receive an invoice from [facility]. Please contact [facility] at [phone] with questions or concerns regarding your invoice.   IF you received labwork today, you will receive an  invoice from [place]. Please contact [facility] at [phone] with questions or concerns regarding your invoice.   Our billing staff will not be able to assist you with questions regarding bills from these companies.  You will be contacted with the lab results as soon as they are available. The fastest way to get your results is to activate your My Chart account. Instructions are located on the last page of this  paperwork. If you have not heard from [place] regarding the results in 2 weeks, please contact this office.

## 2019-11-10 NOTE — Progress Notes (Signed)
Subjective:  Patient ID: Brad Gray, male    DOB: Nov 07, 1970  Age: 49 y.o. MRN: [id number]  CC:  Chief Complaint  Patient presents with  . Follow-up    on back pain, neck pain, and chest pain. pt states he hasn't felt the neck nor th chest pain since last visit. pt reports improvment in his Lower back pain, but he still feels it. pain in pt's back is a dull ache with movment. 3/10 pain level currently.     HPI Brad Gray presents for   Follow-up of upper back pain, neck pain, chest pain. Evaluated April 23.  Some strenuous work with his job.  Had noticed some pain after dead lifting a year or 2 previously, pain between shoulder blades and upper mid back. Dysesthesias in the feet at times.  Saw rheumatology in the past with congenital hand abnormalities without known history of RA, negative rheum factor and ANA.  Treated for low back pain 2019 with physical therapy recommended.  That was cost prohibitive, was doing home exercises.  Chest x-ray without active cardiopulmonary disease, minimal spondylosis at C3-4 and C4-5 on cervical spine, mid thoracic spondylosis on T-spine imaging without acute bony abnormality, negative scapula x-ray.  Mild AC joint hypertrophy on the right. Treated with meloxicam 7.5 mg daily as needed, Flexeril for possible cervical radiculopathy as well as overuse component with underlying cervical, thoracic spondylosis. Activity modification. Referred to cardiology with previous chest pain although suspected more chest wall pain.  Nicotine use as cardiac risk factor.  Normal sed rate, lipid panel.  Mild elevated creatinine at 1.39 but other CMP normal  Appt with cardiology next Friday. Less physical exertion - on light duty. No chest pain. No dyspnea. On chantix now to quit smoking - 6-8 per day (down from 1pack per day)  Neck/upper back better. Taking mobic daily. Flexeril on ly as needed - few nights to help sleep. Min soreness. 3/10, less stiffness. No  weakness. No pain into arms.   Low back still sore at times. Numbness in toes comes and goes. Low back pain - off and on. Not at present. Would like to return to full duty.   Elevated creatinine: Lab Results  Component Value Date   CREATININE 1.23 11/10/2019  cr 1.11 in 06/2017.  Drinking water during the day - drinking more now. 127 ounces per day now. Minimal prior.  Takes creatine and fat burner supplements.  Anal warts, genital warts: Discussed in July 2019.  tried Aldara cream in past - only on penis. Helped lesions on penis - one smal lesion returned 6 months ago.  Due to number of anal lesions, referred to dermatology - unable to be seen due to outstanding balance. Has not had treatment recently.      History Patient Active Problem List   Diagnosis Date Noted  . Condyloma acuminata-perirectal 10/07/2011  . BACK PAIN 10/02/2008  . CONDYLOMA ACUMINATA, PENIS 06/14/2007  . ARTHRITIS 06/14/2007   Past Medical History:  Diagnosis Date  . Joint pain    Past Surgical History:  Procedure Laterality Date  . KNEE ARTHROSCOPY    . SHOULDER ARTHROSCOPY     No Known Allergies Prior to Admission medications   Medication Sig Start Date End Date Taking? Authorizing Provider  cyclobenzaprine (FLEXERIL) 5 MG tablet 1 pill by mouth up to every 8 hours as needed. Start with one pill by mouth each bedtime as needed due to sedation 10/27/19  Yes [name], MD  ibuprofen (ADVIL,MOTRIN) 200 MG tablet Take 200 mg by mouth every 6 (six) hours as needed.   Yes [provider]  meloxicam (MOBIC) 7.5 MG tablet Take 1 tablet (7.5 mg total) by mouth daily. 10/27/19  Yes [name], MD   Social History   Socioeconomic History  . Marital status: Single    Spouse name: Not on file  . Number of children: Not on file  . Years of education: Not on file  . Highest education level: Not on file  Occupational History  . Not on file  Tobacco Use  . Smoking status: Current  Every Day Smoker    Packs/day: 0.20    Types: Cigarettes  . Smokeless tobacco: Never Used  . Tobacco comment: pt states he now smokes about 6-8 cigarettes per day  Substance and Sexual Activity  . Alcohol use: Yes    Comment: socially  . Drug use: No  . Sexual activity: Yes    Birth control/protection: None  Other Topics Concern  . Not on file  Social History Narrative  . Not on file   Social Determinants of Health   Financial Resource Strain:   . Difficulty of Paying Living Expenses:   Food Insecurity:   . Worried About Programme researcher, broadcasting/film/video in the Last Year:   . Barista in the Last Year:   Transportation Needs:   . Freight forwarder (Medical):   Marland Kitchen Lack of Transportation (Non-Medical):   Physical Activity:   . Days of Exercise per Week:   . Minutes of Exercise per Session:   Stress:   . Feeling of Stress :   Social Connections:   . Frequency of Communication with Friends and Family:   . Frequency of Social Gatherings with Friends and Family:   . Attends Religious Services:   . Active Member of Clubs or Organizations:   . Attends Banker Meetings:   Marland Kitchen Marital Status:   Intimate Partner Violence:   . Fear of Current or Ex-Partner:   . Emotionally Abused:   Marland Kitchen Physically Abused:   . Sexually Abused:     Review of Systems  Per HPI Objective:   Vitals:   11/10/19 1021  BP: 111/71  Pulse: 71  Temp: (!) 97.5 F (36.4 C)  TempSrc: Temporal  SpO2: 96%  Weight: 196 lb (88.9 kg)  Height: 5\' 6"  (1.676 m)     Physical Exam Vitals reviewed.  Constitutional:      Appearance: He is well-developed.  HENT:     Head: Normocephalic and atraumatic.  Eyes:     Pupils: Pupils are equal, round, and reactive to light.  Neck:     Vascular: No carotid bruit or JVD.  Cardiovascular:     Rate and Rhythm: Normal rate and regular rhythm.     Heart sounds: Normal heart sounds. No murmur.  Pulmonary:     Effort: Pulmonary effort is normal.      Breath sounds: Normal breath sounds. No rales.  Genitourinary:    Comments: Approximately 5 mm genital wart on the right penile shaft.  2 larger condyloma appearing lesions at the 11:00 and 1:00 area just above the anus.  Elevated, approximately 1 cm across. Musculoskeletal:     Comments: C-spine no midline bony tenderness, minimal discomfort at the rhomboids without midline bony tenderness of thoracic spine.  Notes some discomfort in thoracic spine with bilateral arm external rotation/extension at shoulders.   Lumbar spine no focal bony tenderness.  Negative seated straight leg raise.  Some difficulty with obtaining reflexes upper and lower extremities but equal bilaterally.  1+ Achilles bilaterally.  Skin:    General: Skin is warm and dry.  Neurological:     Mental Status: He is alert and oriented to person, place, and time.     No results found.   Assessment & Plan:  Chrisandra NettersLamont O Meece is a 49 y.o. male . Thoracic back pain, unspecified back pain laterality, unspecified chronicity - Plan: Ambulatory referral to Spine Surgery Chest wall pain Chronic bilateral low back pain without sciatica - Plan: DG Lumbar Spine Complete, Ambulatory referral to Spine Surgery  - improved. Some persistent back pain, cervical spondylosis.   - refer to spine specialist  - continue mobic for now, symptomatic care.   - requests return to work full duty - initial trial, but may need to return to restrictions depending on symptoms.   - follow up with cardiology as planned.   Elevated serum creatinine - Plan: repeat Basic metabolic panel, has increased hydration. Stressed importance given supplement use.   Cigarette nicotine dependence without complication  - commended on decrease.   Genital warts - Plan: imiquimod (ALDARA) 5 % cream  - trial of aldara, side effects and dosing options discussed. recheck 1 month.   Meds ordered this encounter  Medications  . imiquimod (ALDARA) 5 % cream    Sig: Apply  topically 3 (three) times a week. To affected area.    Dispense:  12 each    Refill:  1   Patient Instructions   Ok to return to work as a trial, but if worsening pain I would recommend returning to light duty. I will also refer you to back specialist to decide on physical therapy or other imaging like MRI.  Ok to continue mobic for now, flexeril only if needed.  Try to avoid heavy lifting if possible, and use your legs.  Return to the clinic or go to the nearest emergency room if any of your symptoms worsen or new symptoms occur.  I will check kidney test again today. continue to drink plenty of fluids.   aldara cream to genital warts 3 times per week.  If that causes too much irritation to the skin, can take a break for a few days or week or 2 if needed.  Follow-up with me in 1 month and we can decide if a dermatology eval is needed.  Return to the clinic or go to the nearest emergency room if any of your symptoms worsen or new symptoms occur.      Chronic Back Pain When back pain lasts longer than 3 months, it is called chronic back pain.The cause of your back pain may not be known. Some common causes include:  Wear and tear (degenerative disease) of the bones, ligaments, or disks in your back.  Inflammation and stiffness in your back (arthritis). People who have chronic back pain often go through certain periods in which the pain is more intense (flare-ups). Many people can learn to manage the pain with home care. Follow these instructions at home: Pay attention to any changes in your symptoms. Take these actions to help with your pain: Activity   Avoid bending and other activities that make the problem worse.  Maintain a proper position when standing or sitting: ? When standing, keep your upper back and neck straight, with your shoulders pulled back. Avoid slouching. ? When sitting, keep your back straight and relax your shoulders. Do  not round your shoulders or pull them  backward.  Do not sit or stand in one place for long periods of time.  Take brief periods of rest throughout the day. This will reduce your pain. Resting in a lying or standing position is usually better than sitting to rest.  When you are resting for longer periods, mix in some mild activity or stretching between periods of rest. This will help to prevent stiffness and pain.  Get regular exercise. Ask your health care provider what activities are safe for you.  Always use proper lifting technique, which includes: ? Bending your knees. ? Keeping the load close to your body. ? Avoiding twisting.  Sleep on a firm mattress in a comfortable position. Try lying on your side with your knees slightly bent. If you lie on your back, put a pillow under your knees. Managing pain  If directed, apply ice to the painful area. Your health care provider may recommend applying ice during the first 24-48 hours after a flare-up begins. ? Put ice in a plastic bag. ? Place a towel between your skin and the bag. ? Leave the ice on for 20 minutes, 2-3 times per day.  If directed, apply heat to the affected area as often as told by your health care provider. Use the heat source that your health care provider recommends, such as a moist heat pack or a heating pad. ? Place a towel between your skin and the heat source. ? Leave the heat on for 20-30 minutes. ? Remove the heat if your skin turns bright red. This is especially important if you are unable to feel pain, heat, or cold. You may have a greater risk of getting burned.  Try soaking in a warm tub.  Take over-the-counter and prescription medicines only as told by your health care provider.  Keep all follow-up visits as told by your health care provider. This is important. Contact a health care provider if:  You have pain that is not relieved with rest or medicine. Get help right away if:  You have weakness or numbness in one or both of your legs or  feet.  You have trouble controlling your bladder or your bowels.  You have nausea or vomiting.  You have pain in your abdomen.  You have shortness of breath or you faint. This information is not intended to replace advice given to you by your health care provider. Make sure you discuss any questions you have with your health care provider. Document Revised: 10/13/2018 Document Reviewed: 12/30/2016 Elsevier Patient Education  2020 [employer].   Coping with Quitting Smoking  Quitting smoking is a physical and mental challenge. You will face cravings, withdrawal symptoms, and temptation. Before quitting, work with your health care provider to make a plan that can help you cope. Preparation can help you quit and keep you from giving in. How can I cope with cravings? Cravings usually last for 5-10 minutes. If you get through it, the craving will pass. Consider taking the following actions to help you cope with cravings:  Keep your mouth busy: ? Chew sugar-free gum. ? Suck on hard candies or a straw. ? Brush your teeth.  Keep your hands and body busy: ? Immediately change to a different activity when you feel a craving. ? Squeeze or play with a ball. ? Do an activity or a hobby, like making bead jewelry, practicing needlepoint, or working with wood. ? Mix up your normal routine. ? Take a  short exercise break. Go for a quick walk or run up and down stairs. ? Spend time in public places where smoking is not allowed.  Focus on doing something kind or helpful for someone else.  Call a friend or family member to talk during a craving.  Join a support group.  Call a quit line, such as 1-800-QUIT-NOW.  Talk with your health care provider about medicines that might help you cope with cravings and make quitting easier for you. How can I deal with withdrawal symptoms? Your body may experience negative effects as it tries to get used to not having nicotine in the system. These effects  are called withdrawal symptoms. They may include:  Feeling hungrier than normal.  Trouble concentrating.  Irritability.  Trouble sleeping.  Feeling depressed.  Restlessness and agitation.  Craving a cigarette. To manage withdrawal symptoms:  Avoid places, people, and activities that trigger your cravings.  Remember why you want to quit.  Get plenty of sleep.  Avoid coffee and other caffeinated drinks. These may worsen some of your symptoms. How can I handle social situations? Social situations can be difficult when you are quitting smoking, especially in the first few weeks. To manage this, you can:  Avoid parties, bars, and other social situations where people might be smoking.  Avoid alcohol.  Leave right away if you have the urge to smoke.  Explain to your family and friends that you are quitting smoking. Ask for understanding and support.  Plan activities with friends or family where smoking is not an option. What are some ways I can cope with stress? Wanting to smoke may cause stress, and stress can make you want to smoke. Find ways to manage your stress. Relaxation techniques can help. For example:  Breathe slowly and deeply, in through your nose and out through your mouth.  Listen to soothing, relaxing music.  Talk with a family member or friend about your stress.  Light a candle.  Soak in a bath or take a shower.  Think about a peaceful place. What are some ways I can prevent weight gain? Be aware that many people gain weight after they quit smoking. However, not everyone does. To keep from gaining weight, have a plan in place before you quit and stick to the plan after you quit. Your plan should include:  Having healthy snacks. When you have a craving, it may help to: ? Eat plain popcorn, crunchy carrots, celery, or other cut vegetables. ? Chew sugar-free gum.  Changing how you eat: ? Eat small portion sizes at meals. ? Eat 4-6 small meals  throughout the day instead of 1-2 large meals a day. ? Be mindful when you eat. Do not watch television or do other things that might distract you as you eat.  Exercising regularly: ? Make time to exercise each day. If you do not have time for a long workout, do short bouts of exercise for 5-10 minutes several times a day. ? Do some form of strengthening exercise, like weight lifting, and some form of aerobic exercise, like running or swimming.  Drinking plenty of water or other low-calorie or no-calorie drinks. Drink 6-8 glasses of water daily, or as much as instructed by your health care provider. Summary  Quitting smoking is a physical and mental challenge. You will face cravings, withdrawal symptoms, and temptation to smoke again. Preparation can help you as you go through these challenges.  You can cope with cravings by keeping your mouth busy (such  as by chewing gum), keeping your body and hands busy, and making calls to family, friends, or a helpline for people who want to quit smoking.  You can cope with withdrawal symptoms by avoiding places where people smoke, avoiding drinks with caffeine, and getting plenty of rest.  Ask your health care provider about the different ways to prevent weight gain, avoid stress, and handle social situations. This information is not intended to replace advice given to you by your health care provider. Make sure you discuss any questions you have with your health care provider. Document Revised: 06/04/2017 Document Reviewed: 06/19/2016 Elsevier Patient Education  [employer].   If you have lab work done today you will be contacted with your lab results within the next 2 weeks.  If you have not heard from [place] then please contact us. The fastest way to get your results is to register for My Chart.   IF you received an x-ray today, you will receive an invoice from [facility]. Please contact [facility] at [phone] with  questions or concerns regarding your invoice.   IF you received labwork today, you will receive an invoice from [place]. Please contact [facility] at [phone] with questions or concerns regarding your invoice.   Our billing staff will not be able to assist you with questions regarding bills from these companies.  You will be contacted with the lab results as soon as they are available. The fastest way to get your results is to activate your My Chart account. Instructions are located on the last page of this paperwork. If you have not heard from [place] regarding the results in 2 weeks, please contact this office.         Signed, Meredith Staggers, MD [facility]

## 2019-11-11 ENCOUNTER — Encounter: Payer: Self-pay | Admitting: Family Medicine

## 2019-11-17 ENCOUNTER — Encounter: Payer: Self-pay | Admitting: Cardiovascular Disease

## 2019-11-17 ENCOUNTER — Other Ambulatory Visit: Payer: Self-pay

## 2019-11-17 ENCOUNTER — Ambulatory Visit: Payer: 59 | Admitting: Cardiovascular Disease

## 2019-11-17 VITALS — BP 100/64 | HR 64 | Temp 96.8°F | Ht 66.0 in | Wt 193.0 lb

## 2019-11-17 DIAGNOSIS — R072 Precordial pain: Secondary | ICD-10-CM

## 2019-11-17 DIAGNOSIS — Z720 Tobacco use: Secondary | ICD-10-CM | POA: Diagnosis not present

## 2019-11-17 DIAGNOSIS — R0789 Other chest pain: Secondary | ICD-10-CM | POA: Insufficient documentation

## 2019-11-17 NOTE — Assessment & Plan Note (Signed)
25-pack-year tobacco abuse currently trying to quit on Chantix smoking 5 to 6 cigarettes a day

## 2019-11-17 NOTE — Progress Notes (Signed)
11/17/2019 Gerarda Gunther   Apr 08, 1971  [id number]  Primary Physician Wendie Agreste, MD Primary Cardiologist: Lorretta Harp MD Lupe Carney, [place]  HPI:  Brad Gray is a 49 y.o. fit and muscular appearing single African-American male father of one 75 year old daughter referred by Dr. Nyoka Cowden for cardiovascular valuation because of atypical chest pain.  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  His cardiac risk factor profile is notable for 25-pack-year tobacco abuse now trying to quit on Chantix.  There is no family history of heart disease.  Never had heart attack or stroke.  He has had a couple episodes of what sounds like musculoskeletal chest pain after living heavy heavy weights and working long hours with episodic shortness of breath.   Current Meds  Medication Sig  . CHANTIX STARTING MONTH PAK 0.5 MG X 11 & 1 MG X 42 tablet See admin instructions. follow package directions  . cyclobenzaprine (FLEXERIL) 5 MG tablet 1 pill by mouth up to every 8 hours as needed. Start with one pill by mouth each bedtime as needed due to sedation  . ibuprofen (ADVIL,MOTRIN) 200 MG tablet Take 200 mg by mouth every 6 (six) hours as needed.  . imiquimod (ALDARA) 5 % cream Apply topically 3 (three) times a week. To affected area.  . meloxicam (MOBIC) 7.5 MG tablet Take 1 tablet (7.5 mg total) by mouth daily.     No Known Allergies  Social History   Socioeconomic History  . Marital status: Single    Spouse name: Not on file  . Number of children: Not on file  . Years of education: Not on file  . Highest education level: Not on file  Occupational History  . Not on file  Tobacco Use  . Smoking status: Current Every Day Smoker    Packs/day: 0.20    Types: Cigarettes  . Smokeless tobacco: Never Used  . Tobacco comment: pt states he now smokes about 6-8 cigarettes per day  Substance and Sexual Activity  . Alcohol use: Yes    Comment: socially  . Drug  use: No  . Sexual activity: Yes    Birth control/protection: None  Other Topics Concern  . Not on file  Social History Narrative  . Not on file   Social Determinants of Health   Financial Resource Strain:   . Difficulty of Paying Living Expenses:   Food Insecurity:   . Worried About Charity fundraiser in the Last Year:   . Arboriculturist in the Last Year:   Transportation Needs:   . Film/video editor (Medical):   Marland Kitchen Lack of Transportation (Non-Medical):   Physical Activity:   . Days of Exercise per Week:   . Minutes of Exercise per Session:   Stress:   . Feeling of Stress :   Social Connections:   . Frequency of Communication with Friends and Family:   . Frequency of Social Gatherings with Friends and Family:   . Attends Religious Services:   . Active Member of Clubs or Organizations:   . Attends Archivist Meetings:   Marland Kitchen Marital Status:   Intimate Partner Violence:   . Fear of Current or Ex-Partner:   . Emotionally Abused:   Marland Kitchen Physically Abused:   . Sexually Abused:      Review of Systems: General: negative for chills, fever, night sweats or weight changes.  Cardiovascular: negative for chest pain, dyspnea on  exertion, edema, orthopnea, palpitations, paroxysmal nocturnal dyspnea or shortness of breath Dermatological: negative for rash Respiratory: negative for cough or wheezing Urologic: negative for hematuria Abdominal: negative for nausea, vomiting, diarrhea, bright red blood per rectum, melena, or hematemesis Neurologic: negative for visual changes, syncope, or dizziness All other systems reviewed and are otherwise negative except as noted above.    Blood pressure 100/64, pulse 64, temperature (!) 96.8 F (36 C), height 5\' 6"  (1.676 m), weight 193 lb (87.5 kg).  General appearance: alert and no distress Neck: no adenopathy, no carotid bruit, no JVD, supple, symmetrical, trachea midline and thyroid not enlarged, symmetric, no  tenderness/mass/nodules Lungs: clear to auscultation bilaterally Heart: regular rate and rhythm, S1, S2 normal, no murmur, click, rub or gallop Extremities: extremities normal, atraumatic, no cyanosis or edema Pulses: 2+ and symmetric Skin: Skin color, texture, turgor normal. No rashes or lesions Neurologic: Alert and oriented X 3, normal strength and tone. Normal symmetric reflexes. Normal coordination and gait  EKG not performed today.  EKG performed 10/27/2019 revealed sinus bradycardia 55 without ST or T wave changes.  I reviewed that EKG.  ASSESSMENT AND PLAN:   Atypical chest pain Mr. Alper is referred to me by Dr. Lequita Halt for evaluation of atypical chest pain.  He basically has no cardiac risk factors.  Is very active and exercises.  He had some musculoskeletal type pain occurring after working long hours and lifting heavy weights.  These have not recurred.  He is also had some episodic shortness of breath but does have a long history tobacco abuse now on Chantix.  I suspect his pain was noncardiac.  I am going to get a coronary calcium score to further evaluate.  Tobacco abuse 25-pack-year tobacco abuse currently trying to quit on Chantix smoking 5 to 6 cigarettes a day      Chilton Si MD [facility], [facility] 11/17/2019 10:14 AM

## 2019-11-17 NOTE — Patient Instructions (Signed)
Medication Instructions:  NO CHANGE *If you need a refill on your cardiac medications before your next appointment, please call your pharmacy*   Lab Work: If you have labs (blood work) drawn today and your tests are completely normal, you will receive your results only by: Marland Kitchen MyChart Message (if you have MyChart) OR . A paper copy in the mail If you have any lab test that is abnormal or we need to change your treatment, we will call you to review the results.   Testing/Procedures:  CT CALCIUM SCORING AT [address]   Follow-Up: At [facility], you and your health needs are our priority.  As part of our continuing mission to provide you with exceptional heart care, we have created designated Provider Care Teams.  These Care Teams include your primary Cardiologist (physician) and Advanced Practice Providers (APPs -  Physician Assistants and Nurse Practitioners) who all work together to provide you with the care you need, when you need it.  We recommend signing up for the patient portal called "MyChart".  Sign up information is provided on this After Visit Summary.  MyChart is used to connect with patients for Virtual Visits (Telemedicine).  Patients are able to view lab/test results, encounter notes, upcoming appointments, etc.  Non-urgent messages can be sent to your provider as well.   To learn more about what you can do with MyChart, go to [email or web address].    Your next appointment:    AS NEEDED

## 2019-11-17 NOTE — Assessment & Plan Note (Signed)
Brad Gray is referred to me by Dr. Chilton Si for evaluation of atypical chest pain.  He basically has no cardiac risk factors.  Is very active and exercises.  He had some musculoskeletal type pain occurring after working long hours and lifting heavy weights.  These have not recurred.  He is also had some episodic shortness of breath but does have a long history tobacco abuse now on Chantix.  I suspect his pain was noncardiac.  I am going to get a coronary calcium score to further evaluate.

## 2019-11-21 ENCOUNTER — Encounter: Payer: Self-pay | Admitting: Radiology

## 2019-11-26 ENCOUNTER — Other Ambulatory Visit: Payer: Self-pay | Admitting: Family Medicine

## 2019-11-26 DIAGNOSIS — R079 Chest pain, unspecified: Secondary | ICD-10-CM

## 2019-11-26 DIAGNOSIS — M546 Pain in thoracic spine: Secondary | ICD-10-CM

## 2019-11-26 DIAGNOSIS — M542 Cervicalgia: Secondary | ICD-10-CM

## 2019-11-26 DIAGNOSIS — R0789 Other chest pain: Secondary | ICD-10-CM

## 2019-11-26 DIAGNOSIS — R200 Anesthesia of skin: Secondary | ICD-10-CM

## 2019-11-26 NOTE — Telephone Encounter (Signed)
Requested medications are due for refill today?  Yes  Requested medications are on active medication list? Yes  Last Refill:   10/27/2019  # 30 with no refills   Future visit scheduled?  Yes in one week.    Notes to Clinic:  Medication failed RX refill protocol due to no Hgb in past 360 days.  Last Hgb performed on 05/26/2017.

## 2019-11-27 NOTE — Telephone Encounter (Signed)
Patient is requesting a refill of the following medications: Requested Prescriptions   Pending Prescriptions Disp Refills  . meloxicam (MOBIC) 7.5 MG tablet [Pharmacy Med Name: MELOXICAM 7.5MG  TABLETS] 30 tablet 0    Sig: TAKE 1 TABLET(7.5 MG) BY MOUTH DAILY    Date of patient request:11/26/2019 Last office visit: 11/10/2019 Date of last refill: 10/27/2019 Last refill amount: 30 tablets  Follow up time period per chart: 12/08/2019

## 2019-12-08 ENCOUNTER — Ambulatory Visit: Payer: 59 | Admitting: Family Medicine

## 2020-01-30 ENCOUNTER — Other Ambulatory Visit: Payer: Self-pay | Admitting: Physical Medicine and Rehabilitation

## 2020-01-30 DIAGNOSIS — M5414 Radiculopathy, thoracic region: Secondary | ICD-10-CM

## 2020-02-18 ENCOUNTER — Ambulatory Visit
Admission: RE | Admit: 2020-02-18 | Discharge: 2020-02-18 | Disposition: A | Discharge status: Home / Self Care | Payer: 59 | Source: Ambulatory Visit | Attending: Physical Medicine and Rehabilitation | Admitting: Physical Medicine and Rehabilitation

## 2020-02-18 ENCOUNTER — Other Ambulatory Visit: Payer: Self-pay

## 2020-02-18 DIAGNOSIS — M5414 Radiculopathy, thoracic region: Secondary | ICD-10-CM

## 2020-04-16 ENCOUNTER — Ambulatory Visit
Admission: RE | Admit: 2020-04-16 | Discharge: 2020-04-16 | Disposition: A | Discharge status: Home / Self Care | Payer: Worker's Compensation | Source: Ambulatory Visit | Attending: Nurse Practitioner | Admitting: Nurse Practitioner

## 2020-04-16 ENCOUNTER — Other Ambulatory Visit: Payer: Self-pay

## 2020-04-16 ENCOUNTER — Other Ambulatory Visit: Payer: Self-pay | Admitting: Nurse Practitioner

## 2020-04-16 DIAGNOSIS — R609 Edema, unspecified: Secondary | ICD-10-CM

## 2020-04-16 DIAGNOSIS — M25531 Pain in right wrist: Secondary | ICD-10-CM

## 2021-08-08 IMAGING — CR DG FINGER THUMB 2+V*R*
3 series · 3 of 3 positions shown · non-contrast
Comparison: None.

CLINICAL DATA: Right pain some and swelling after injury yesterday

EXAM:
RIGHT THUMB 2+V

[x finger pa right]
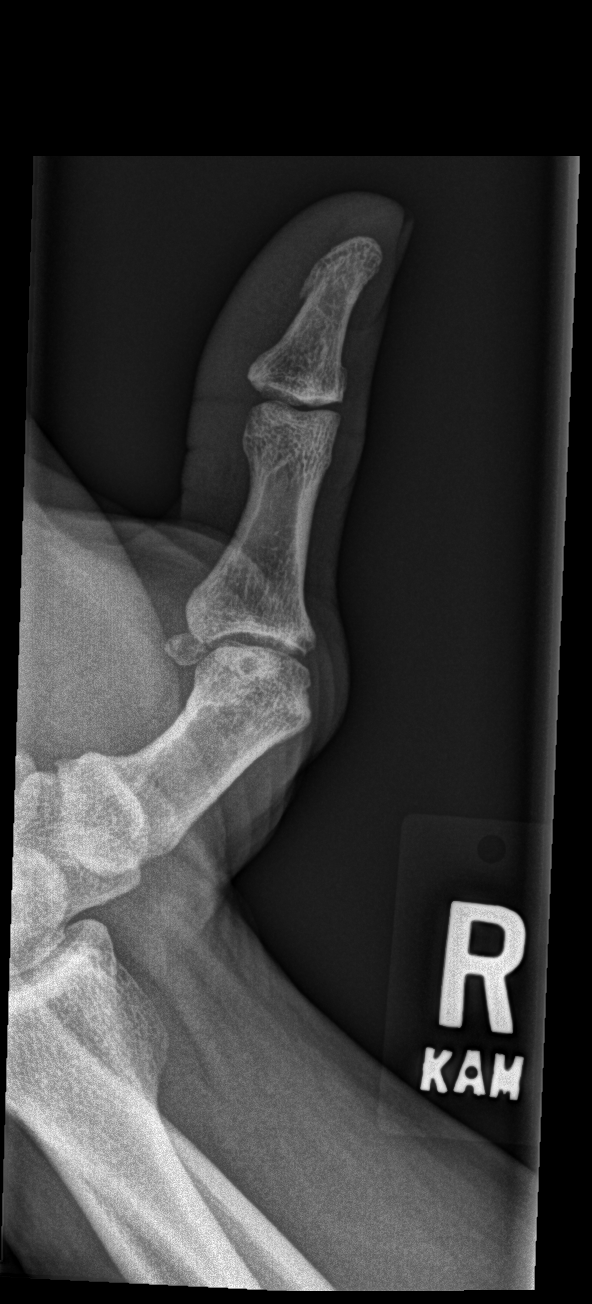

[x finger lat right]
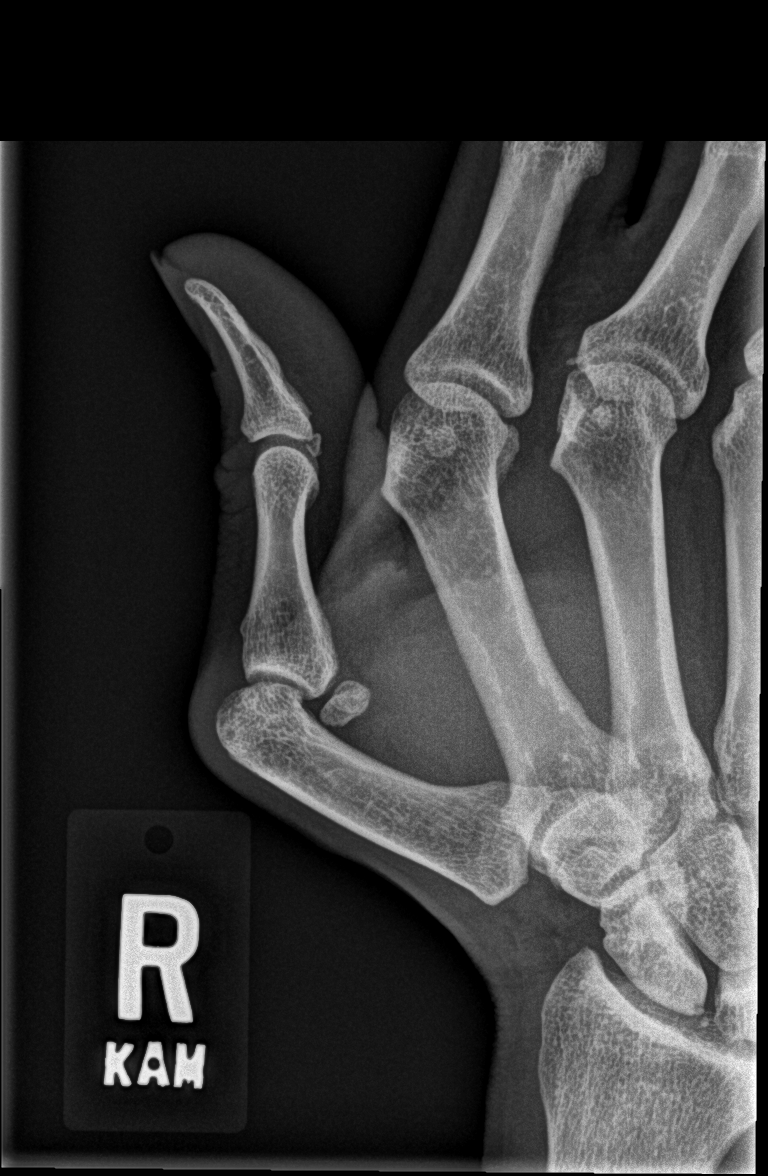

[x finger obl right]
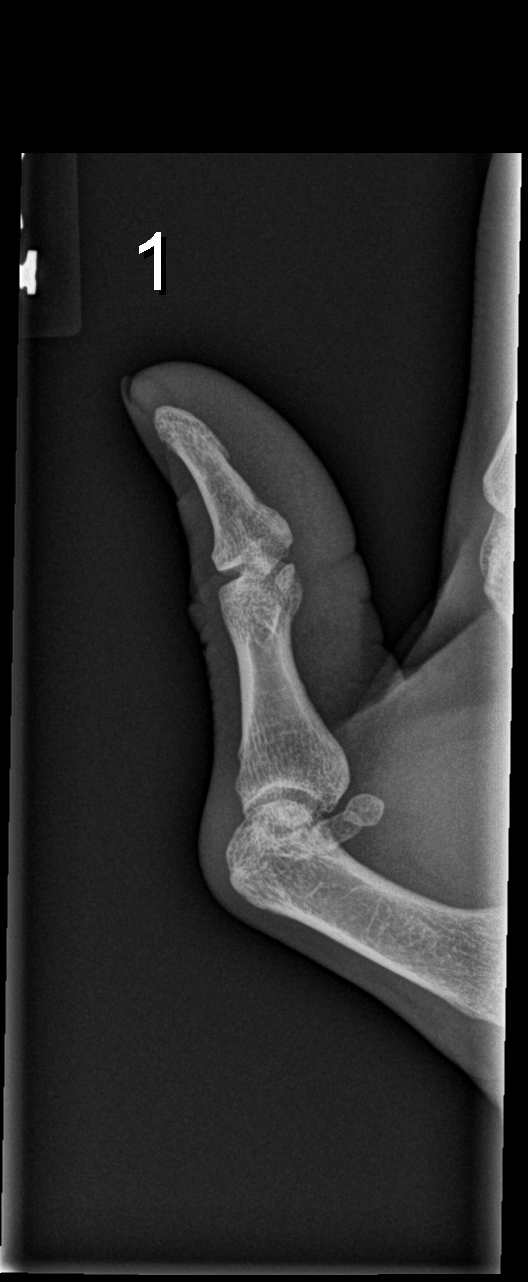

[3 of 3 positions shown; findings below may reference images not displayed]

FINDINGS: No fracture or dislocation. No focal osseous lesions. Slight ulnar
subluxation of the thumb at first metacarpophalangeal joint. Minimal
first MTP joint osteoarthritis. No radiopaque foreign bodies.
IMPRESSION: No acute osseous abnormality. Slight ulnar subluxation of the thumb
at the first metacarpophalangeal joint. Minimal first MTP joint
osteoarthritis.

## 2021-08-08 IMAGING — CR DG WRIST COMPLETE 3+V*R*
4 series · 4 of 4 positions shown · non-contrast
Comparison: None.

CLINICAL DATA: Right wrist pain and swelling in first metacarpal
region after injury yesterday

EXAM:
RIGHT WRIST - COMPLETE 3+ VIEW

[x wrist pa right]
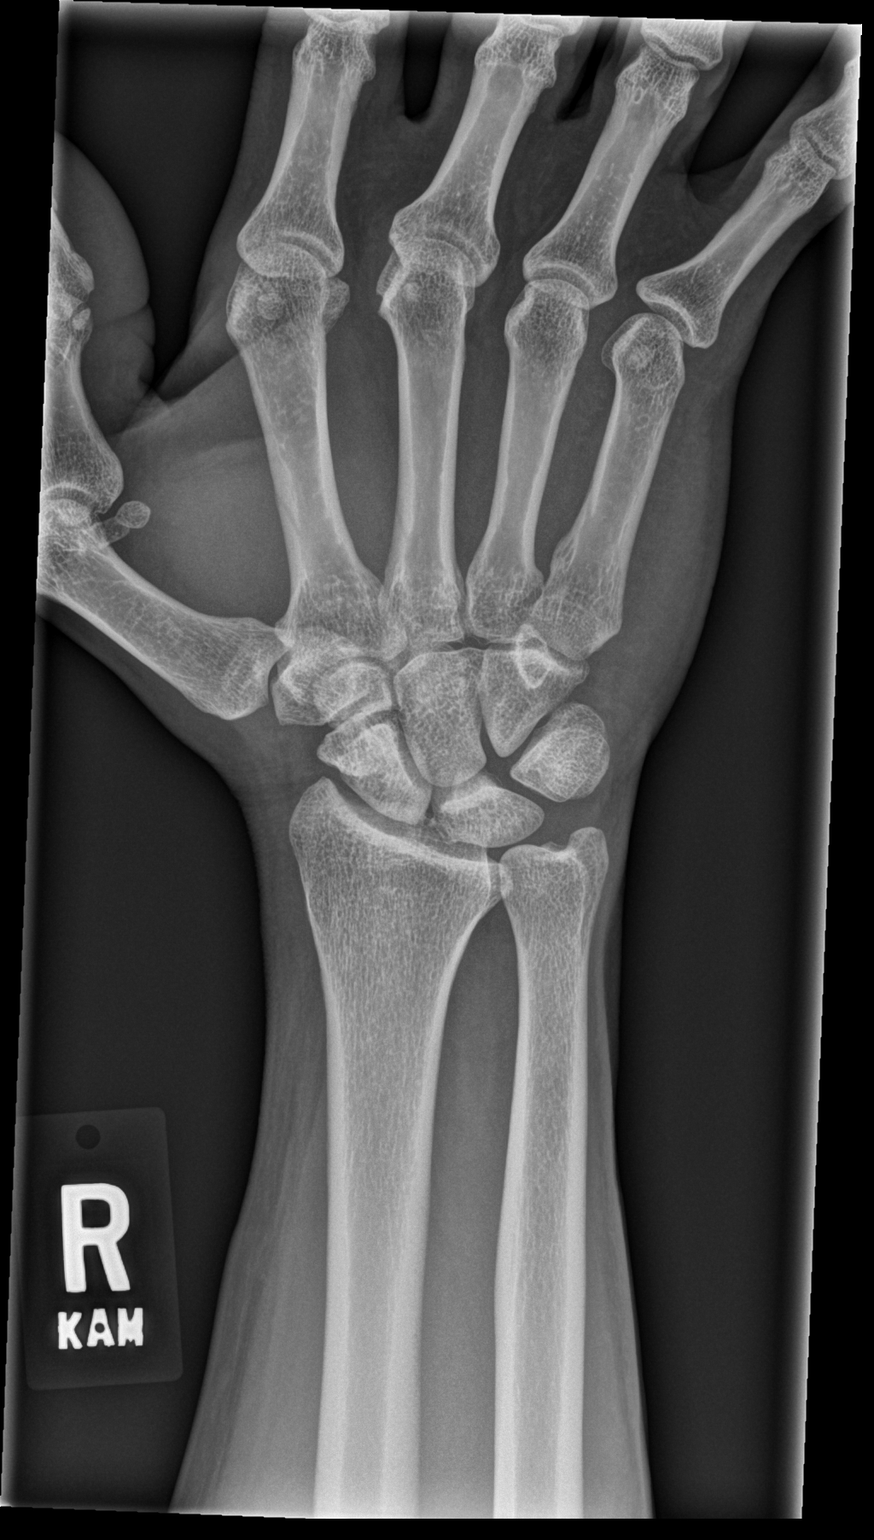

[x wrist obl right]
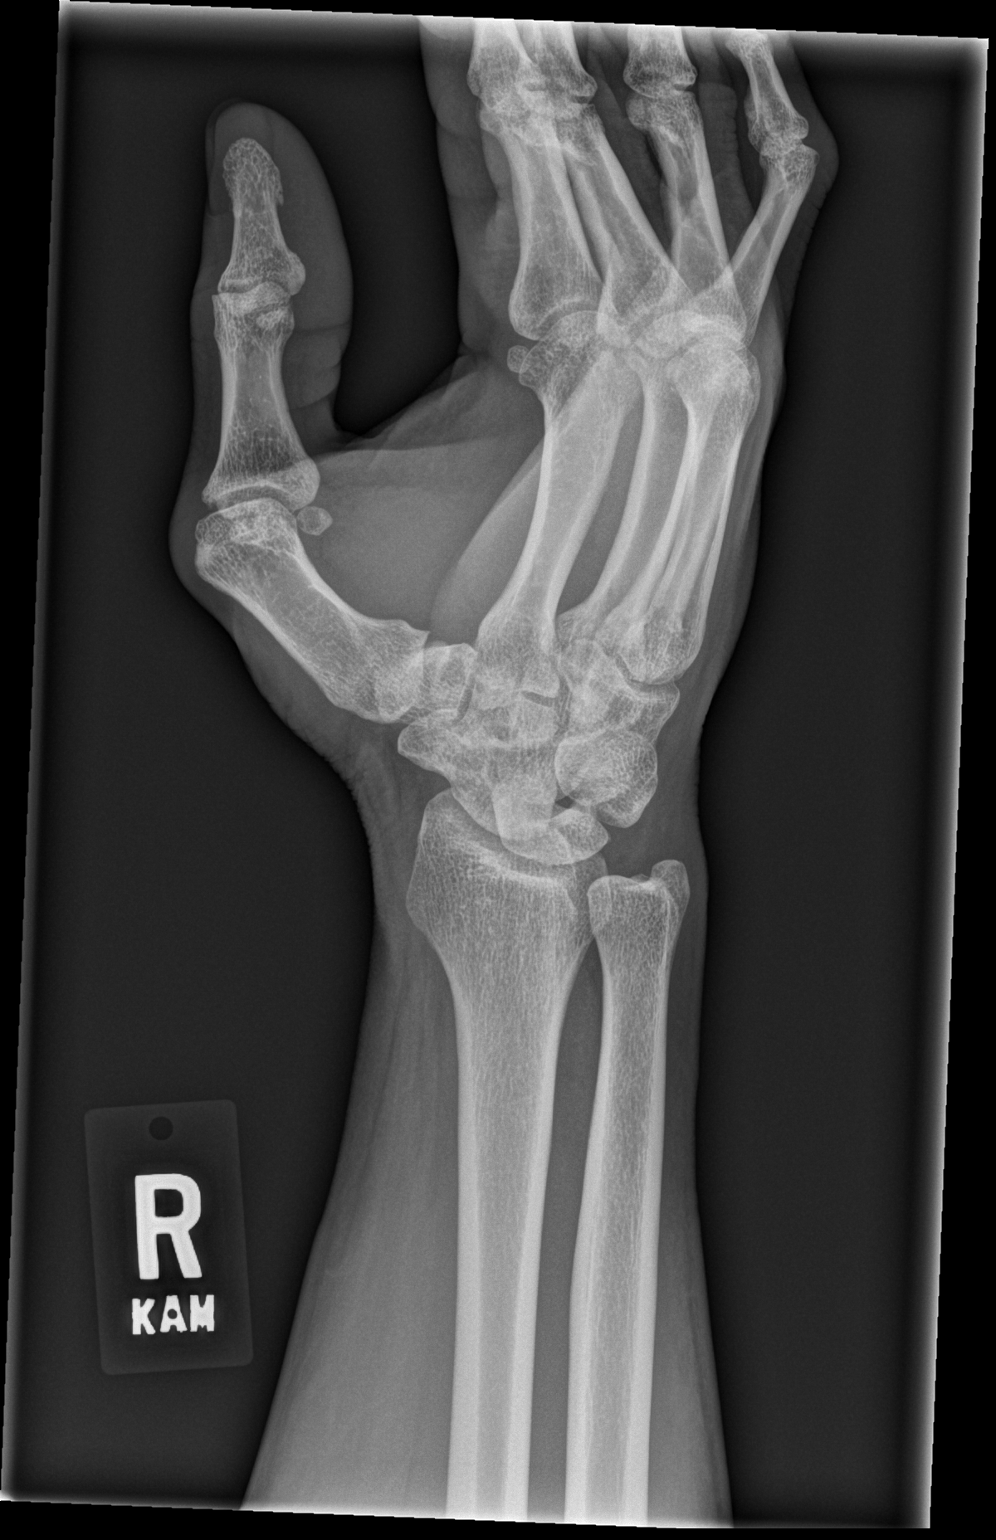

[x wrist lat right (1 of 2)]
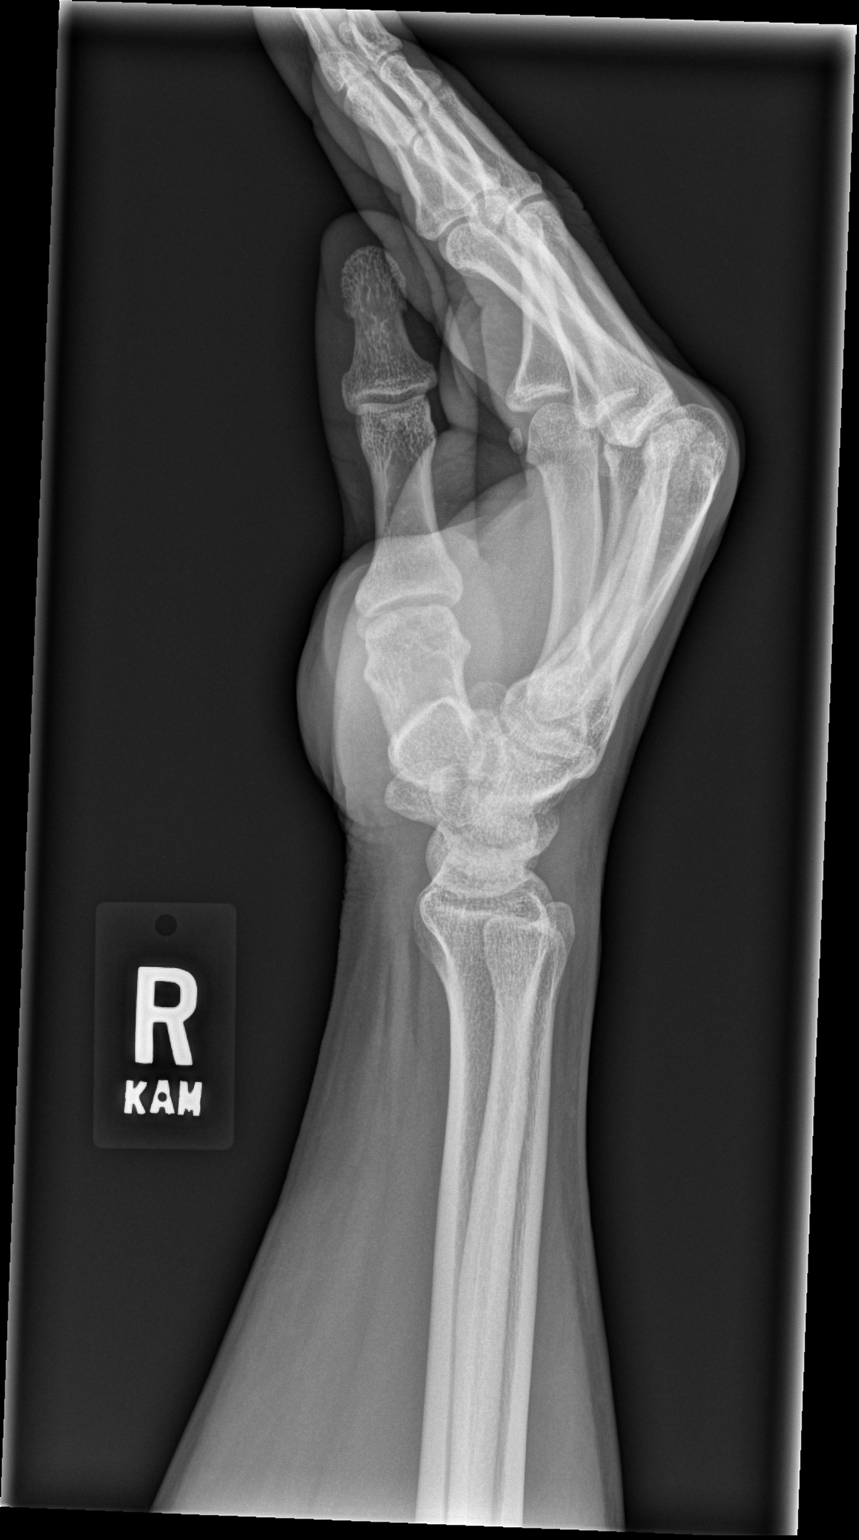

[x wrist lat right (2 of 2)]
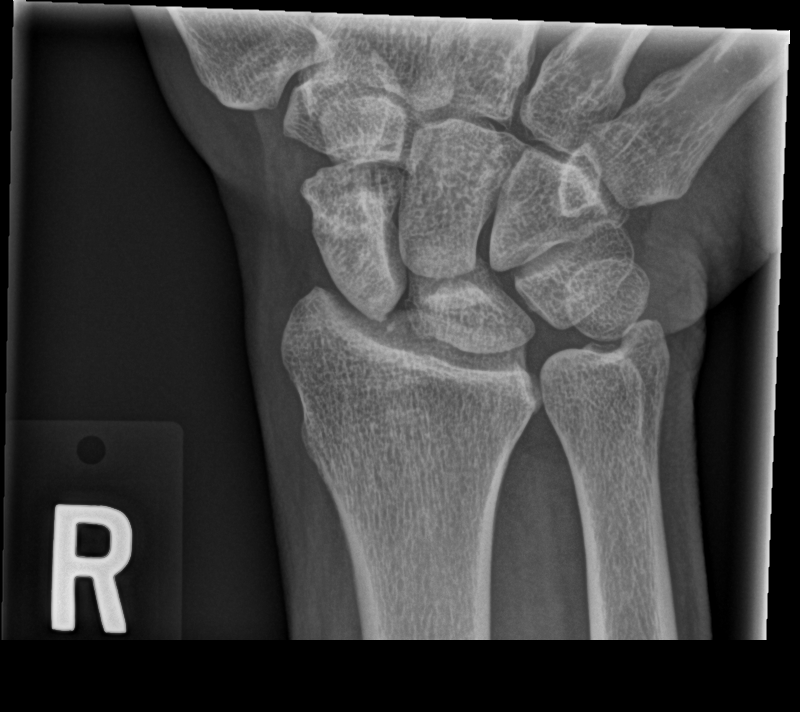

[4 of 4 positions shown; findings below may reference images not displayed]

FINDINGS: No fracture or dislocation. No suspicious focal osseous lesions. No
significant arthropathy. No radiopaque foreign bodies.
IMPRESSION: No acute osseous abnormality.  No significant degenerative changes.

## 2023-04-13 ENCOUNTER — Other Ambulatory Visit: Payer: Self-pay | Admitting: Urology

## 2023-04-14 ENCOUNTER — Telehealth: Payer: Self-pay

## 2023-04-14 LAB — PSA: Prostate Specific Ag, Serum: 3.2 ng/mL (ref 0.0–4.0)

## 2023-04-14 NOTE — Telephone Encounter (Signed)
Left message on voicemail requesting a return call. Attempted to contact patient regarding lab results.  ?

## 2023-04-15 ENCOUNTER — Other Ambulatory Visit: Payer: Self-pay | Admitting: Nurse Practitioner

## 2023-04-15 ENCOUNTER — Ambulatory Visit
Admission: RE | Admit: 2023-04-15 | Discharge: 2023-04-15 | Disposition: A | Discharge status: Home / Self Care | Payer: Worker's Compensation | Source: Ambulatory Visit | Attending: Nurse Practitioner | Admitting: Nurse Practitioner

## 2023-04-15 DIAGNOSIS — T1490XA Injury, unspecified, initial encounter: Secondary | ICD-10-CM

## 2023-09-15 ENCOUNTER — Other Ambulatory Visit: Payer: Self-pay | Admitting: Nurse Practitioner

## 2023-09-15 ENCOUNTER — Ambulatory Visit
Admission: RE | Admit: 2023-09-15 | Discharge: 2023-09-15 | Disposition: A | Discharge status: Home / Self Care | Source: Ambulatory Visit | Attending: Nurse Practitioner | Admitting: Nurse Practitioner

## 2023-09-15 DIAGNOSIS — S4991XA Unspecified injury of right shoulder and upper arm, initial encounter: Secondary | ICD-10-CM

## 2023-10-07 ENCOUNTER — Other Ambulatory Visit: Payer: Self-pay

## 2023-10-07 ENCOUNTER — Emergency Department (HOSPITAL_COMMUNITY)

## 2023-10-07 ENCOUNTER — Emergency Department (HOSPITAL_COMMUNITY)
Admission: EM | Admit: 2023-10-07 | Discharge: 2023-10-07 | Disposition: A | Discharge status: Home / Self Care | Attending: Emergency Medicine | Admitting: Emergency Medicine

## 2023-10-07 DIAGNOSIS — I4891 Unspecified atrial fibrillation: Secondary | ICD-10-CM | POA: Diagnosis not present

## 2023-10-07 DIAGNOSIS — R000 Tachycardia, unspecified: Secondary | ICD-10-CM | POA: Diagnosis present

## 2023-10-07 LAB — CBC WITH DIFFERENTIAL/PLATELET
Abs Immature Granulocytes: 0.05 10*3/uL (ref 0.00–0.07)
Basophils Absolute: 0.1 10*3/uL (ref 0.0–0.1)
Basophils Relative: 0 %
Eosinophils Absolute: 0.5 10*3/uL (ref 0.0–0.5)
Eosinophils Relative: 4 %
HCT: 49.2 % (ref 39.0–52.0)
Hemoglobin: 16.5 g/dL (ref 13.0–17.0)
Immature Granulocytes: 0 %
Lymphocytes Relative: 18 %
Lymphs Abs: 2.4 10*3/uL (ref 0.7–4.0)
MCH: 30.6 pg (ref 26.0–34.0)
MCHC: 33.5 g/dL (ref 30.0–36.0)
MCV: 91.3 fL (ref 80.0–100.0)
Monocytes Absolute: 0.7 10*3/uL (ref 0.1–1.0)
Monocytes Relative: 6 %
Neutro Abs: 9.8 10*3/uL — ABNORMAL HIGH (ref 1.7–7.7)
Neutrophils Relative %: 72 %
Platelets: 257 10*3/uL (ref 150–400)
RBC: 5.39 MIL/uL (ref 4.22–5.81)
RDW: 14.6 % (ref 11.5–15.5)
WBC: 13.5 10*3/uL — ABNORMAL HIGH (ref 4.0–10.5)
nRBC: 0 % (ref 0.0–0.2)

## 2023-10-07 LAB — COMPREHENSIVE METABOLIC PANEL WITH GFR
ALT: 21 U/L (ref 0–44)
AST: 22 U/L (ref 15–41)
Albumin: 3.4 g/dL — ABNORMAL LOW (ref 3.5–5.0)
Alkaline Phosphatase: 54 U/L (ref 38–126)
Anion gap: 7 (ref 5–15)
BUN: 12 mg/dL (ref 6–20)
CO2: 25 mmol/L (ref 22–32)
Calcium: 9.5 mg/dL (ref 8.9–10.3)
Chloride: 106 mmol/L (ref 98–111)
Creatinine, Ser: 1.32 mg/dL — ABNORMAL HIGH (ref 0.61–1.24)
GFR, Estimated: >60 mL/min (ref >60)
Glucose, Bld: 135 mg/dL — ABNORMAL HIGH (ref 70–99)
Potassium: 3.8 mmol/L (ref 3.5–5.1)
Sodium: 138 mmol/L (ref 135–145)
Total Bilirubin: 0.7 mg/dL (ref 0.0–1.2)
Total Protein: 6 g/dL — ABNORMAL LOW (ref 6.5–8.1)

## 2023-10-07 LAB — BRAIN NATRIURETIC PEPTIDE: B Natriuretic Peptide: 62.1 pg/mL (ref 0.0–100.0)

## 2023-10-07 LAB — TROPONIN I (HIGH SENSITIVITY)
Troponin I (High Sensitivity): 49 ng/L — ABNORMAL HIGH (ref <18)
Troponin I (High Sensitivity): 46 ng/L — ABNORMAL HIGH (ref <18)

## 2023-10-07 MED ORDER — DILTIAZEM HCL-DEXTROSE 125-5 MG/125ML-% IV SOLN (PREMIX)
5.0000 mg/h | INTRAVENOUS | Status: DC
  Filled 2023-10-07: qty 125

## 2023-10-07 MED ORDER — DILTIAZEM HCL 25 MG/5ML IV SOLN
10.0000 mg | Freq: Once | INTRAVENOUS | Status: AC
  Administered 2023-10-07: 10 mg via INTRAVENOUS
  Filled 2023-10-07: qty 5

## 2023-10-07 MED ORDER — SODIUM CHLORIDE 0.9 % IV BOLUS
500.0000 mL | Freq: Once | INTRAVENOUS | Status: AC
  Administered 2023-10-07: 500 mL via INTRAVENOUS

## 2023-10-07 NOTE — ED Provider Notes (Signed)
 [facility]ENC[facility]PARTMENT AT [facility] Provider Note   CSN: [id number] Arrival date & time: 10/07/23  1248     History  Chief Complaint  Patient presents with   Abnormal ECG   Tachycardia    Brad Gray is a 53 y.o. male.  Patient states that he was getting test done preop for his shoulder surgery and they found him to be in a rapid heart rate.  The history is provided by the patient and medical records. No language interpreter was used.  Weakness Severity:  Mild Onset quality:  Sudden Timing:  Intermittent Progression:  Waxing and waning Chronicity:  New Context: not alcohol use   Relieved by:  Nothing Worsened by:  Nothing Ineffective treatments:  None tried Associated symptoms: no abdominal pain, no chest pain, no cough, no diarrhea, no frequency, no headaches and no seizures        Home Medications Prior to Admission medications   Medication Sig Start Date End Date Taking? Authorizing Provider  CHANTIX STARTING MONTH PAK 0.5 MG X 11 & 1 MG X 42 tablet See admin instructions. follow package directions 11/01/19   [provider]  cyclobenzaprine (FLEXERIL) 5 MG tablet 1 pill by mouth up to every 8 hours as needed. Start with one pill by mouth each bedtime as needed due to sedation 10/27/19   [name], MD  ibuprofen (ADVIL,MOTRIN) 200 MG tablet Take 200 mg by mouth every 6 (six) hours as needed.    [provider]  imiquimod (ALDARA) 5 % cream Apply topically 3 (three) times a week. To affected area. 11/10/19   [name], MD  meloxicam (MOBIC) 7.5 MG tablet TAKE 1 TABLET(7.5 MG) BY MOUTH DAILY 11/28/19   [name], MD      Allergies    Patient has no known allergies.    Review of Systems   Review of Systems  Constitutional:  Negative for appetite change and fatigue.  HENT:  Negative for congestion, ear discharge and sinus pressure.   Eyes:  Negative for discharge.  Respiratory:  Negative for cough.    Cardiovascular:  Negative for chest pain.  Gastrointestinal:  Negative for abdominal pain and diarrhea.  Genitourinary:  Negative for frequency and hematuria.  Musculoskeletal:  Negative for back pain.  Skin:  Negative for rash.  Neurological:  Positive for weakness. Negative for seizures and headaches.  Psychiatric/Behavioral:  Negative for hallucinations.     Physical Exam Updated Vital Signs BP 130/74   Pulse 77   Temp 99.4 F (37.4 C) (Oral)   Resp 20   Ht 5\' 6"  (1.676 m)   Wt 87.7 kg   SpO2 98%   BMI 31.21 kg/m  Physical Exam Vitals and nursing note reviewed.  Constitutional:      Appearance: He is well-developed.  HENT:     Head: Normocephalic.     Nose: Nose normal.  Eyes:     General: No scleral icterus.    Conjunctiva/sclera: Conjunctivae normal.  Neck:     Thyroid: No thyromegaly.  Cardiovascular:     Rate and Rhythm: Tachycardia present. Rhythm irregular.     Heart sounds: No murmur heard.    No friction rub. No gallop.  Pulmonary:     Breath sounds: No stridor. No wheezing or rales.  Chest:     Chest wall: No tenderness.  Abdominal:     General: There is no distension.     Tenderness: There is no abdominal tenderness.  There is no rebound.  Musculoskeletal:        General: Normal range of motion.     Cervical back: Neck supple.  Lymphadenopathy:     Cervical: No cervical adenopathy.  Skin:    Findings: No erythema or rash.  Neurological:     Mental Status: He is alert and oriented to person, place, and time.     Motor: No abnormal muscle tone.     Coordination: Coordination normal.  Psychiatric:        Behavior: Behavior normal.     ED Results / Procedures / Treatments   Labs (all labs ordered are listed, but only abnormal results are displayed) Labs Reviewed  CBC WITH DIFFERENTIAL/PLATELET - Abnormal; Notable for the following components:      Result Value   WBC 13.5 (*)    Neutro Abs 9.8 (*)    All other components within normal  limits  COMPREHENSIVE METABOLIC PANEL WITH GFR - Abnormal; Notable for the following components:   Glucose, Bld 135 (*)    Creatinine, Ser 1.32 (*)    Total Protein 6.0 (*)    Albumin 3.4 (*)    All other components within normal limits  TROPONIN I (HIGH SENSITIVITY) - Abnormal; Notable for the following components:   Troponin I (High Sensitivity) 49 (*)    All other components within normal limits  BRAIN NATRIURETIC PEPTIDE  TROPONIN I (HIGH SENSITIVITY)    EKG EKG Interpretation Date/Time:  Thursday October 07 2023 14:13:17 EDT Ventricular Rate:  68 PR Interval:  147 QRS Duration:  78 QT Interval:  334 QTC Calculation: 356 R Axis:   123  Text Interpretation: Right and left arm electrode reversal, interpretation assumes no reversal Sinus rhythm Consider left atrial enlargement Right axis deviation Repol abnrm suggests ischemia, lateral leads Confirmed by Bethann Berkshire [phone]) on 10/07/2023 2:40:19 PM  Radiology DG Chest Port 1 View Result Date: 10/07/2023 CLINICAL DATA:  Shortness of breath EXAM: PORTABLE CHEST 1 VIEW COMPARISON:  Chest radiograph dated 10/27/2019 FINDINGS: Normal lung volumes. No focal consolidations. No pleural effusion or pneumothorax. The heart size and mediastinal contours are within normal limits. No acute osseous abnormality. IMPRESSION: No active disease. Electronically Signed   By: Agustin Cree M.D.   On: 10/07/2023 14:11    Procedures Procedures    Medications Ordered in ED Medications  diltiazem (CARDIZEM) 125 mg in dextrose 5% 125 mL (1 mg/mL) infusion (5 mg/hr Intravenous Not Given 10/07/23 1420)  diltiazem (CARDIZEM) injection 10 mg (10 mg Intravenous Given 10/07/23 1324)  sodium chloride 0.9 % bolus 500 mL (0 mLs Intravenous Stopped 10/07/23 1610)    ED Course/ Medical Decision Making/ A&P                                 Medical Decision Making Amount and/or Complexity of Data Reviewed Labs: ordered. Radiology: ordered. ECG/medicine tests:  ordered.  Risk Prescription drug management.   Atrial fibs that has converted with Cardizem.  Patient will be dispositioned by Dr Renaye Rakers after second troponin        Final Clinical Impression(s) / ED Diagnoses Final diagnoses:  None    Rx / DC Orders ED Discharge Orders     None         Bethann Berkshire, MD 10/10/23 1140

## 2023-10-07 NOTE — Discharge Instructions (Addendum)
 Follow-up with a family doctor in the next couple weeks to recheck your kidney function.    I referred you to the A-fib clinic that is run by cardiologist.  If you do not hear from their scheduling department within 2 business days, call the number above for heart care and request a follow-up appointment with a cardiologist.  Some people go in and out of A-fib, which is an irregular heartbeat.  It is possible that he may experience this again.  If you do have shortness of breath, chest pain or pressure, difficulty breathing, dizziness, return to the emergency department.

## 2023-10-07 NOTE — ED Triage Notes (Signed)
 Pt states that he was due to have an rotator cuff surgery today, but while at the facility they noticed an abnormal HR. He was found to be in Afib RVR there. Denies hx of same, no blood thinners per pt. He states that overnight he felt some palpitations, but denies CP or SOB.

## 2023-10-07 NOTE — ED Provider Notes (Signed)
 53 yo male presenting from pre-op with new onset A Fib with Rvr Given 10 cardizem with cardioversion after to NSR Initial trop 49 Spoke to cardiology who advised outpatient follow up, no indication to start A/C at this time.  Physical Exam  BP 130/74   Pulse 77   Temp 99.4 F (37.4 C) (Oral)   Resp 20   Ht 5\' 6"  (1.676 m)   Wt 87.7 kg   SpO2 98%   BMI 31.21 kg/m   Physical Exam  Procedures  Procedures  ED Course / MDM    Medical Decision Making Amount and/or Complexity of Data Reviewed Labs: ordered. Radiology: ordered. ECG/medicine tests: ordered.  Risk Prescription drug management.   Delta troponin is negative.  Per earlier provider the patient is stable for discharge with outpatient follow-up.  I placed a separate referral to the A-fib clinic specifically to help expedite his follow-up appointment with the specialist.  From earlier provider discussion with the cardiologist, the patient was not requiring anticoagulation at this time, low risk for stroke, CHADSVASC2 score of 0.       Terald Sleeper, MD 10/07/23 [phone]

## 2023-10-11 ENCOUNTER — Encounter (HOSPITAL_COMMUNITY): Payer: Self-pay | Admitting: Physician Assistant

## 2023-10-11 ENCOUNTER — Ambulatory Visit (HOSPITAL_COMMUNITY)
Admission: RE | Admit: 2023-10-11 | Discharge: 2023-10-11 | Disposition: A | Discharge status: Home / Self Care | Source: Ambulatory Visit | Attending: Physician Assistant | Admitting: Physician Assistant

## 2023-10-11 VITALS — BP 120/80 | HR 81 | Ht 66.0 in | Wt 186.6 lb

## 2023-10-11 DIAGNOSIS — I480 Paroxysmal atrial fibrillation: Secondary | ICD-10-CM | POA: Diagnosis not present

## 2023-10-11 DIAGNOSIS — Z87891 Personal history of nicotine dependence: Secondary | ICD-10-CM | POA: Diagnosis not present

## 2023-10-11 MED ORDER — DILTIAZEM HCL 30 MG PO TABS: ORAL_TABLET | ORAL | 1 refills | Status: DC

## 2023-10-11 NOTE — Patient Instructions (Signed)
Cardizem 30mg  -- Take 1 tablet every 4 hours AS NEEDED for AFIB heart rate >100 as long as top BP >100.

## 2023-10-11 NOTE — Progress Notes (Signed)
 Primary Care Physician: [name], MD Primary Cardiologist: None, previously seen by Dr Allyson Sabal Electrophysiologist: None  Referring Physician: ED   Brad Gray is a 53 y.o. male with a history of tobacco use and atrial fibrillation who presents for consultation in the [facility].  The patient was initially diagnosed with atrial fibrillation 10/07/23 incidentally after presenting for a pre op visit for shoulder surgery. He was sent to the ED for evaluation. He was given diltiazem which converted him to SR. Anticoagulation was not started at that time with a low CV score. He did have other issues going on at that time. He was being treated for cellulitis on his face and on prednisone for his shoulder. He also admits that he had more alcohol than usual. He typically drinks alcohol infrequently. Today, patient remains in SR. He denies significant snoring.   Today, he denies symptoms of palpitations, chest pain, shortness of breath, orthopnea, PND, lower extremity edema, dizziness, presyncope, syncope, snoring, daytime somnolence, bleeding, or neurologic sequela. The patient is tolerating medications without difficulties and is otherwise without complaint today.    Atrial Fibrillation Risk Factors:  he does not have symptoms or diagnosis of sleep apnea. he does not have a history of rheumatic fever. he does not have a history of alcohol use. The patient does not have a history of early familial atrial fibrillation or other arrhythmias.  Atrial Fibrillation Management history:  Previous antiarrhythmic drugs: none Previous cardioversions: none Previous ablations: none Anticoagulation history: none  ROS- All systems are reviewed and negative except as per the HPI above.  Past Medical History:  Diagnosis Date   Joint pain     Current Outpatient Medications  Medication Sig Dispense Refill   CHANTIX STARTING MONTH PAK 0.5 MG X 11 & 1 MG X 42 tablet  See admin instructions. follow package directions     cyclobenzaprine (FLEXERIL) 5 MG tablet 1 pill by mouth up to every 8 hours as needed. Start with one pill by mouth each bedtime as needed due to sedation 15 tablet 0   ibuprofen (ADVIL,MOTRIN) 200 MG tablet Take 200 mg by mouth every 6 (six) hours as needed.     imiquimod (ALDARA) 5 % cream Apply topically 3 (three) times a week. To affected area. 12 each 1   meloxicam (MOBIC) 7.5 MG tablet TAKE 1 TABLET(7.5 MG) BY MOUTH DAILY 30 tablet 0   No current facility-administered medications for this encounter.    Physical Exam: There were no vitals taken for this visit.  GEN: Well nourished, well developed in no acute distress NECK: No JVD; No carotid bruits CARDIAC: Regular rate and rhythm, no murmurs, rubs, gallops RESPIRATORY:  Clear to auscultation without rales, wheezing or rhonchi  ABDOMEN: Soft, non-tender, non-distended EXTREMITIES:  No edema; No deformity   Wt Readings from Last 3 Encounters:  10/07/23 87.7 kg  11/17/19 87.5 kg  11/10/19 88.9 kg     EKG today demonstrates  SR Vent. rate 81 BPM PR interval 140 ms QRS duration 86 ms QT/QTcB 342/397 ms     CHA2DS2-VASc Score = 0  The patient's score is based upon: CHF History: 0 HTN History: 0 Diabetes History: 0 Stroke History: 0 Vascular Disease History: 0 Age Score: 0 Gender Score: 0       ASSESSMENT AND PLAN: Paroxysmal Atrial Fibrillation (ICD10:  I48.0) The patient's CHA2DS2-VASc score is 0, indicating a 0.2% annual risk of stroke.   General education about afib provided  and questions answered. We also discussed his stroke risk and the risks and benefits of anticoagulation. Patient in SR today.  Check echocardiogram Start diltiazem 30 mg PRN q 4 hours for heart racing Not currently on anticoagulation with low CV score.  We discussed smart device technology for home monitoring. If he has more afib in the future, can consider AAD or ablation.     Will  have patient follow up with Dr Allyson Sabal to reestablish care.        Jorja Loa PA-C [facility] [address] [place], [place] [address] [phone]

## 2023-10-12 ENCOUNTER — Other Ambulatory Visit (HOSPITAL_COMMUNITY): Payer: Self-pay

## 2023-10-12 DIAGNOSIS — I4891 Unspecified atrial fibrillation: Secondary | ICD-10-CM

## 2023-10-20 ENCOUNTER — Telehealth: Payer: Self-pay | Admitting: Cardiovascular Disease

## 2023-10-20 NOTE — Telephone Encounter (Signed)
 Pt has appt with Dr Katheryne Pane on 05/5, but patient wants to know if there is anyway he can move his appt up closer to the echo being done on 4/28. The surgeon can get him back in for a surgery date of 11/09/23 if they can get an all clear from Dr. Katheryne Pane if not they cannot do surgery for almost two months. He was already schedule and they had to stop the surgery because he was in afib.

## 2023-10-21 NOTE — Telephone Encounter (Signed)
 Pt called back regarding sooner appointment with Dr. Katheryne Pane. Pt has his echo scheduled for 4/28. Pt is scheduled to have rotator cuff surgery and would like to get pre-op clearance as soon as possible. Scheduled pt to see Dr. Katheryne Pane on Wednesday, April 30th at [facility]. New office information sent to pt via mychart. Pt has no further questions at this time.

## 2023-10-21 NOTE — Telephone Encounter (Signed)
 Left message for pt to call back regarding sooner appointment with Dr. Katheryne Pane.

## 2023-10-22 ENCOUNTER — Other Ambulatory Visit (HOSPITAL_COMMUNITY): Payer: Self-pay | Admitting: Physician Assistant

## 2023-11-01 ENCOUNTER — Ambulatory Visit (HOSPITAL_COMMUNITY)
Admission: RE | Admit: 2023-11-01 | Discharge: 2023-11-01 | Disposition: A | Discharge status: Home / Self Care | Source: Ambulatory Visit | Attending: Physician Assistant | Admitting: Physician Assistant

## 2023-11-01 DIAGNOSIS — I4891 Unspecified atrial fibrillation: Secondary | ICD-10-CM | POA: Diagnosis present

## 2023-11-01 LAB — ECHOCARDIOGRAM COMPLETE
Area-P 1/2: 3.34 cm2
Calc EF: 41.6 %
S' Lateral: 3.2 cm
Single Plane A2C EF: 45.3 %
Single Plane A4C EF: 39.6 %

## 2023-11-03 ENCOUNTER — Ambulatory Visit: Attending: Cardiovascular Disease | Admitting: Cardiovascular Disease

## 2023-11-03 ENCOUNTER — Encounter: Payer: Self-pay | Admitting: Cardiovascular Disease

## 2023-11-03 VITALS — BP 112/72 | HR 72 | Ht 66.0 in | Wt 191.0 lb

## 2023-11-03 DIAGNOSIS — I480 Paroxysmal atrial fibrillation: Secondary | ICD-10-CM

## 2023-11-03 DIAGNOSIS — Z01818 Encounter for other preprocedural examination: Secondary | ICD-10-CM

## 2023-11-03 DIAGNOSIS — I519 Heart disease, unspecified: Secondary | ICD-10-CM

## 2023-11-03 DIAGNOSIS — Z720 Tobacco use: Secondary | ICD-10-CM

## 2023-11-03 LAB — LIPID PANEL
Chol/HDL Ratio: 3.4 ratio (ref 0.0–5.0)
Cholesterol, Total: 162 mg/dL (ref 100–199)
HDL: 47 mg/dL (ref >39)
LDL Chol Calc (NIH): 103 mg/dL — ABNORMAL HIGH (ref 0–99)
Triglycerides: 63 mg/dL (ref 0–149)
VLDL Cholesterol Cal: 12 mg/dL (ref 5–40)

## 2023-11-03 LAB — HEPATIC FUNCTION PANEL
ALT: 19 IU/L (ref 0–44)
AST: 19 IU/L (ref 0–40)
Albumin: 4.5 g/dL (ref 4.1–5.1)
Alkaline Phosphatase: 62 IU/L (ref 44–121)
Bilirubin Total: 0.3 mg/dL (ref 0.0–1.2)
Bilirubin, Direct: 0.12 mg/dL (ref 0.00–0.40)
Total Protein: 6.5 g/dL (ref 6.0–8.5)

## 2023-11-03 NOTE — Patient Instructions (Signed)
 Medication Instructions:  Your physician recommends that you continue on your current medications as directed. Please refer to the Current Medication list given to you today.  *If you need a refill on your cardiac medications before your next appointment, please call your pharmacy*  Lab Work: Your physician recommends that you have labs drawn today: Lipid/liver panel  If you have labs (blood work) drawn today and your tests are completely normal, you will receive your results only by: MyChart Message (if you have MyChart) OR A paper copy in the mail If you have any lab test that is abnormal or we need to change your treatment, we will call you to review the results.  Testing/Procedures: Dr. Katheryne Pane has ordered a Lexiscan Myocardial Perfusion Imaging Study.  Please arrive 15 minutes prior to your appointment time for registration and insurance purposes.   The test will take approximately 3 to 4 hours to complete; you may bring reading material.  If someone comes with you to your appointment, they will need to remain in the main lobby due to limited space in the testing area. **If you are pregnant or breastfeeding, please notify the nuclear lab prior to your appointment**   How to prepare for your Myocardial Perfusion Test: Do not eat or drink 3 hours prior to your test, except you may have water. Do not consume products containing caffeine (regular or decaffeinated) 12 hours prior to your test. (ex: coffee, chocolate, sodas, tea). Do wear comfortable clothes (no dresses or overalls) and walking shoes, tennis shoes preferred (No heels or open toe shoes are allowed). Do NOT wear cologne, perfume, aftershave, or lotions (deodorant is allowed). If you use an inhaler, use it the AM of your test and bring it with you.  If you use a nebulizer, use it the AM of your test.  If these instructions are not followed, your test will have to be rescheduled.   Your physician has requested that you have an  echocardiogram. Echocardiography is a painless test that uses sound waves to create images of your heart. It provides your doctor with information about the size and shape of your heart and how well your heart's chambers and valves are working. This procedure takes approximately one hour. There are no restrictions for this procedure. Please do NOT wear cologne, perfume, aftershave, or lotions (deodorant is allowed). Please arrive 15 minutes prior to your appointment time.  Please note: We ask at that you not bring children with you during ultrasound (echo/ vascular) testing. Due to room size and safety concerns, children are not allowed in the ultrasound rooms during exams. Our front office staff cannot provide observation of children in our lobby area while testing is being conducted. An adult accompanying a patient to their appointment will only be allowed in the ultrasound room at the discretion of the ultrasound technician under special circumstances. We apologize for any inconvenience. **To do in August**   Follow-Up: At [facility], you and your health needs are our priority.  As part of our continuing mission to provide you with exceptional heart care, our providers are all part of one team.  This team includes your primary Cardiologist (physician) and Advanced Practice Providers or APPs (Physician Assistants and Nurse Practitioners) who all work together to provide you with the care you need, when you need it.  Your next appointment:   6 month(s)  Provider:   Lauro Portal, MD    We recommend signing up for the patient portal called "MyChart".  Sign up information is provided on this After Visit Summary.  MyChart is used to connect with patients for Virtual Visits (Telemedicine).  Patients are able to view lab/test results, encounter notes, upcoming appointments, etc.  Non-urgent messages can be sent to your provider as well.   To learn more about what you can do with MyChart, go  to [email or web address].

## 2023-11-03 NOTE — Assessment & Plan Note (Signed)
 Patient was found to be PAF at the time of his shoulder surgery on 10/07/23.  He converted with diltiazem .  He is unaware that he was in A-fib.  He was seen by Valeda Garter, PA-C in the A-fib clinic on 10/11/23.  He was not started on anticoagulants because of low CV score.  He was in sinus rhythm at the time.

## 2023-11-03 NOTE — Progress Notes (Signed)
 11/03/2023 Brad Gray   Jul 01, 1971  [id number]  Primary Physician Brad Bras, MD Primary Cardiologist: Brad Leigh MD Brad Gray . HPI:  Brad Gray is a 53 y.o.   fit and muscular appearing single African-American male father of one 69 year old daughter referred by Dr. Marrie Gray for cardiovascular valuation because of atypical chest pain.  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  His cardiac risk factor profile is notable for 25-pack-year tobacco abuse now trying to quit on Chantix.  There is no family history of heart disease.  Never had heart attack or stroke.  He has had a couple episodes of what sounds like musculoskeletal chest pain after living heavy heavy weights and working long hours with episodic shortness of breath.  Since I saw him 4 years ago he now works as a Sports administrator.  He is very active and works out 1 to 2 hours a day after a 10-hour workday at [employer] as well as 3 hours on the weekend.  He is completely asymptomatic.  He did injure his right shoulder and was scheduled to have a surgical procedure on 10/07/23 but preoperatively he was found to have asymptomatic A-fib.  He was sent to the ER and converted on diltiazem .  He did see Brad Fenton, PA-C in the office 4 days later.  Oral anticoagulation was not started because of his low CV risk.  A 2D echo performed 10/24/23 revealed an EF of 40 to 45% with global hypokinesia but no valvular abnormalities.   Current Meds  Medication Sig   diltiazem  (CARDIZEM ) 30 MG tablet TAKE 1 TABLET BY MOUTH EVERY 4 HOURS AS NEEDED FOR AFIB HEART RATE>100 AS LONG AS TOP BLOOD PRESSURE>100 (Patient taking differently: TAKE 1 TABLET BY MOUTH EVERY 4 HOURS AS NEEDED FOR AFIB HEART RATE>100 AS LONG AS TOP BLOOD PRESSURE>100  taking as needed)   ibuprofen (ADVIL,MOTRIN) 200 MG tablet Take 200 mg by mouth every 6 (six) hours as needed.   tadalafil (CIALIS) 10 MG tablet Take 1  tablet by mouth daily.     No Known Allergies  Social History   Socioeconomic History   Marital status: Single    Spouse name: Not on file   Number of children: Not on file   Years of education: Not on file   Highest education level: Not on file  Occupational History   Not on file  Tobacco Use   Smoking status: Every Day    Current packs/day: 0.20    Types: Cigarettes   Smokeless tobacco: Never   Tobacco comments:    pt states he now smokes about 3 cigarettes per day  Substance and Sexual Activity   Alcohol use: Yes    Comment: socially   Drug use: No   Sexual activity: Yes    Birth control/protection: None  Other Topics Concern   Not on file  Social History Narrative   Not on file   Social Drivers of Health   Financial Resource Strain: Not on file  Food Insecurity: Not on file  Transportation Needs: Not on file  Physical Activity: Not on file  Stress: Not on file  Social Connections: Not on file  Intimate Partner Violence: Not on file     Review of Systems: General: negative for chills, fever, night sweats or weight changes.  Cardiovascular: negative for chest pain, dyspnea on exertion, edema, orthopnea, palpitations, paroxysmal nocturnal dyspnea or shortness of breath  Dermatological: negative for rash Respiratory: negative for cough or wheezing Urologic: negative for hematuria Abdominal: negative for nausea, vomiting, diarrhea, bright red blood per rectum, melena, or hematemesis Neurologic: negative for visual changes, syncope, or dizziness All other systems reviewed and are otherwise negative except as noted above.    Blood pressure 112/72, pulse 72, height 5\' 6"  (1.676 m), weight 191 lb (86.6 kg), SpO2 100%.  General appearance: alert and no distress Neck: no adenopathy, no carotid bruit, no JVD, supple, symmetrical, trachea midline, and thyroid not enlarged, symmetric, no tenderness/mass/nodules Lungs: clear to auscultation bilaterally Heart: regular  rate and rhythm, S1, S2 normal, no murmur, click, rub or gallop Extremities: extremities normal, atraumatic, no cyanosis or edema Pulses: 2+ and symmetric Skin: Skin color, texture, turgor normal. No rashes or lesions Neurologic: Grossly normal  EKG not performed today      ASSESSMENT AND PLAN:   Tobacco abuse Ongoing tobacco abuse of 1/2 pack per day recalcitrant to resector modification.  Paroxysmal atrial fibrillation (HCC) Patient was found to be PAF at the time of his shoulder surgery on 10/07/23.  He converted with diltiazem .  He is unaware that he was in A-fib.  He was seen by Brad Garter, PA-C in the A-fib clinic on 10/11/23.  He was not started on anticoagulants because of low CV score.  He was in sinus rhythm at the time.  Left ventricular dysfunction Patient had a 2D echo performed 10/24/23 that revealed an EF of 40 to 45% with global hypokinesia and no valvular abnormalities.  Diastolic parameters were normal.  He is scheduled for right shoulder surgery.  I am going to get a Lexiscan Myoview to her stratify him.  I am going to repeat the echo in 3 months.  If his echo shows persistent LV dysfunction he will need a more thorough workup and referral to the advanced heart failure clinic for initiation of GDMT.     Brad Leigh MD FACP,FACC,FAHA, [facility] 11/03/2023 9:30 AM

## 2023-11-03 NOTE — Assessment & Plan Note (Signed)
 Patient had a 2D echo performed 10/24/23 that revealed an EF of 40 to 45% with global hypokinesia and no valvular abnormalities.  Diastolic parameters were normal.  He is scheduled for right shoulder surgery.  I am going to get a Lexiscan Myoview to her stratify him.  I am going to repeat the echo in 3 months.  If his echo shows persistent LV dysfunction he will need a more thorough workup and referral to the advanced heart failure clinic for initiation of GDMT.

## 2023-11-03 NOTE — Assessment & Plan Note (Signed)
 Ongoing tobacco abuse of 1/2 pack per day recalcitrant to resector modification.

## 2023-11-04 ENCOUNTER — Encounter: Payer: Self-pay | Admitting: *Deleted

## 2023-11-05 ENCOUNTER — Telehealth: Payer: Self-pay | Admitting: Cardiovascular Disease

## 2023-11-05 ENCOUNTER — Encounter (HOSPITAL_COMMUNITY): Payer: Self-pay

## 2023-11-05 ENCOUNTER — Other Ambulatory Visit: Payer: Self-pay | Admitting: Cardiovascular Disease

## 2023-11-05 DIAGNOSIS — Z01818 Encounter for other preprocedural examination: Secondary | ICD-10-CM

## 2023-11-05 DIAGNOSIS — Z720 Tobacco use: Secondary | ICD-10-CM

## 2023-11-05 DIAGNOSIS — I480 Paroxysmal atrial fibrillation: Secondary | ICD-10-CM

## 2023-11-05 DIAGNOSIS — I519 Heart disease, unspecified: Secondary | ICD-10-CM

## 2023-11-05 NOTE — Telephone Encounter (Signed)
 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  A letter has been completed and sent patient via mychart   Any question patient can contact office back

## 2023-11-05 NOTE — Telephone Encounter (Signed)
 Pt states that his job is requiring a Letter stating that he has NO restrictions. Requesting cb

## 2023-11-05 NOTE — Telephone Encounter (Signed)
 Spoke to patient . He states his job needs a letter stating he can work without restriction. They placed him minor restriction - - no driving ,no operating machinery.  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   Patient states the letter can be sent to him by [facility]  or he can pick it up from the office.   Patient has not had echo or myoview completed yet.   Echo is schedule on 02/10/24 and Myoview is schedule on 11/10/23.     RN informed patient will send message to Dr Katheryne Pane.   RN informed patient that Dr Katheryne Pane probably could not comment on restriction until the tests are  completed.  Patient verbalized understanding

## 2023-11-05 NOTE — Telephone Encounter (Signed)
 PK to return to work w/o restrictions   Brad Gray, Frederico Jan, MD

## 2023-11-08 ENCOUNTER — Ambulatory Visit: Admitting: Cardiovascular Disease

## 2023-11-10 ENCOUNTER — Ambulatory Visit (HOSPITAL_COMMUNITY): Attending: Cardiology

## 2023-11-10 DIAGNOSIS — Z01818 Encounter for other preprocedural examination: Secondary | ICD-10-CM | POA: Insufficient documentation

## 2023-11-10 DIAGNOSIS — Z720 Tobacco use: Secondary | ICD-10-CM | POA: Diagnosis present

## 2023-11-10 DIAGNOSIS — Z01810 Encounter for preprocedural cardiovascular examination: Secondary | ICD-10-CM

## 2023-11-10 DIAGNOSIS — I480 Paroxysmal atrial fibrillation: Secondary | ICD-10-CM | POA: Diagnosis present

## 2023-11-10 DIAGNOSIS — I519 Heart disease, unspecified: Secondary | ICD-10-CM | POA: Insufficient documentation

## 2023-11-10 LAB — MYOCARDIAL PERFUSION IMAGING
LV dias vol: 169 mL (ref 62–150)
LV sys vol: 108 mL
Nuc Stress EF: 36 %
Peak HR: 97 {beats}/min
Rest HR: 66 {beats}/min
Rest Nuclear Isotope Dose: 10.9 mCi
SDS: 0
SRS: 1
SSS: 0
ST Depression (mm): 0 mm
Stress Nuclear Isotope Dose: 32.2 mCi
TID: 1.09

## 2023-11-10 MED ORDER — REGADENOSON 0.4 MG/5ML IV SOLN
INTRAVENOUS | Status: AC
  Filled 2023-11-10: qty 5

## 2023-11-10 MED ORDER — TECHNETIUM TC 99M TETROFOSMIN IV KIT
10.9000 | PACK | Freq: Once | INTRAVENOUS | Status: AC | PRN
  Administered 2023-11-10: 10.9 via INTRAVENOUS

## 2023-11-10 MED ORDER — REGADENOSON 0.4 MG/5ML IV SOLN
0.4000 mg | Freq: Once | INTRAVENOUS | Status: AC
  Administered 2023-11-10: 0.4 mg via INTRAVENOUS

## 2023-11-10 MED ORDER — TECHNETIUM TC 99M TETROFOSMIN IV KIT
32.2000 | PACK | Freq: Once | INTRAVENOUS | Status: AC | PRN
  Administered 2023-11-10: 32.2 via INTRAVENOUS

## 2023-11-20 ENCOUNTER — Ambulatory Visit: Payer: Self-pay | Admitting: Cardiovascular Disease

## 2023-11-25 ENCOUNTER — Telehealth: Payer: Self-pay | Admitting: Cardiovascular Disease

## 2023-11-25 NOTE — Telephone Encounter (Signed)
 Dr. Katheryne Pane  We have received a surgical clearance request for Brad Gray for upcoming arthroscopy of the right shoulder with rotator cuff repair.. They were seen recently in clinic on 11/03/2023.  He completed a Myoview  that was low risk as well as echocardiogram.  Can you please comment on surgical clearance for his upcoming shoulder procedure. Please forward you guidance and recommendations to P [facility] PREOP   Thank you, Charles Connor, NP

## 2023-11-25 NOTE — Telephone Encounter (Signed)
   Pre-operative Risk Assessment    Patient Name: Brad Gray  DOB: 02-Jun-1971 MRN: [id number]      Request for Surgical Clearance    Procedure:  Arthroscopy rt shoulder with Rotator cuff repair  Date of Surgery:  Clearance 12/09/23                                 Surgeon:  Dr [place] Case Surgeon's Group or Practice Name:  [facility] Phone number:  [phone] Fax number:  [phone] (Would prefer email [email or web address] )   Type of Clearance Requested:   - Medical    Type of Anesthesia:  General    Additional requests/questions:    Jennine Mohair   11/25/2023, 3:18 PM

## 2023-11-26 ENCOUNTER — Telehealth: Payer: Self-pay | Admitting: Cardiovascular Disease

## 2023-11-26 NOTE — Telephone Encounter (Signed)
 See pre-operative clearance telephone encounter.  Pre-op clearance has been faxed via pre-op APP and email per myself.   Left voicemail for pt stating this. Call back number left for questions or concerns.

## 2023-11-26 NOTE — Telephone Encounter (Signed)
   Patient Name: Brad Gray  DOB: 09-05-1970 MRN: [id number]  Primary Cardiologist: None  Chart reviewed as part of pre-operative protocol coverage. Given past medical history and time since last visit, based on ACC/AHA guidelines, Brad Gray is at acceptable risk for the planned procedure without further cardiovascular testing.   The patient was advised that if he develops new symptoms prior to surgery to contact our office to arrange for a follow-up visit, and he verbalized understanding.  I will route this recommendation to the requesting party via Epic fax function and remove from pre-op pool.  Please call with questions.  Francene Ing, Retha Cast, NP 11/26/2023, 3:08 PM

## 2023-11-26 NOTE — Telephone Encounter (Signed)
 Patient walked in wanting to see if we could hand him a hard copy of the clearance letter. Patient stated him and the office requesting had been going in circles with us  trying to get that new detailed letter. Pt stated one had been made but was not specific enough. Pt was not happy. Messaged the triage team for assistance 11/26/2023 @ 12:21.

## 2023-11-26 NOTE — Telephone Encounter (Signed)
 Left voicemail to return call to office

## 2023-12-06 ENCOUNTER — Other Ambulatory Visit (HOSPITAL_COMMUNITY)

## 2023-12-07 ENCOUNTER — Other Ambulatory Visit: Payer: Self-pay | Admitting: Urology

## 2023-12-08 LAB — PSA: Prostate Specific Ag, Serum: 6.5 ng/mL — ABNORMAL HIGH (ref 0.0–4.0)

## 2023-12-09 NOTE — Telephone Encounter (Signed)
 Patient informed PSA results are 6.5, which is elevated, does not necessarily mean PSA cancer, but does need to be evaluated by pcp. Patient stated that he is scheduled to see his new PCP in [facility] Dr. Lavone Power, [place] on 12/22/2023. Results to be forwarded to pcp's office. Patient verbalized permission to forward results and understanding.

## 2024-02-10 ENCOUNTER — Ambulatory Visit (HOSPITAL_COMMUNITY)
Admission: RE | Admit: 2024-02-10 | Discharge: 2024-02-10 | Disposition: A | Discharge status: Home / Self Care | Source: Ambulatory Visit | Attending: Cardiology | Admitting: Cardiology

## 2024-02-10 DIAGNOSIS — Z01818 Encounter for other preprocedural examination: Secondary | ICD-10-CM | POA: Insufficient documentation

## 2024-02-10 DIAGNOSIS — Z720 Tobacco use: Secondary | ICD-10-CM | POA: Insufficient documentation

## 2024-02-10 DIAGNOSIS — I519 Heart disease, unspecified: Secondary | ICD-10-CM | POA: Insufficient documentation

## 2024-02-10 DIAGNOSIS — I480 Paroxysmal atrial fibrillation: Secondary | ICD-10-CM | POA: Diagnosis present

## 2024-02-10 LAB — ECHOCARDIOGRAM COMPLETE
Area-P 1/2: 3.06 cm2
S' Lateral: 3.4 cm

## 2024-02-12 ENCOUNTER — Ambulatory Visit: Payer: Self-pay | Admitting: Cardiovascular Disease

## 2024-02-12 DIAGNOSIS — I519 Heart disease, unspecified: Secondary | ICD-10-CM

## 2024-02-16 ENCOUNTER — Other Ambulatory Visit: Payer: Self-pay | Admitting: Cardiovascular Disease

## 2024-02-16 MED ORDER — DILTIAZEM HCL 30 MG PO TABS: ORAL_TABLET | ORAL | 2 refills | Status: DC

## 2024-02-16 MED ORDER — DILTIAZEM HCL 30 MG PO TABS: ORAL_TABLET | ORAL | 0 refills | Status: DC

## 2024-02-16 NOTE — Addendum Note (Signed)
 Addended by: LORRENE FEDERICO CROME on: 02/16/2024 04:53 PM   Modules accepted: Orders
